# Patient Record
Sex: Female | Born: 1940 | Race: White | Hispanic: No | Marital: Married | Smoking: Never smoker
Health system: Southern US, Community
[De-identification: ages and names within clinical notes are randomized; demographics above are authoritative.]

## PROBLEM LIST (undated history)

## (undated) DIAGNOSIS — F0390 Unspecified dementia without behavioral disturbance: Secondary | ICD-10-CM

## (undated) DIAGNOSIS — E785 Hyperlipidemia, unspecified: Secondary | ICD-10-CM

## (undated) HISTORY — DX: Hyperlipidemia, unspecified: E78.5

## (undated) HISTORY — DX: Unspecified dementia, unspecified severity, without behavioral disturbance, psychotic disturbance, mood disturbance, and anxiety: F03.90

---

## 1998-06-06 ENCOUNTER — Ambulatory Visit (HOSPITAL_COMMUNITY): Admission: RE | Admit: 1998-06-06 | Discharge: 1998-06-06 | Payer: Self-pay | Admitting: *Deleted

## 1998-06-07 ENCOUNTER — Emergency Department (HOSPITAL_COMMUNITY): Admission: EM | Admit: 1998-06-07 | Discharge: 1998-06-07 | Payer: Self-pay | Admitting: Emergency Medicine

## 1998-06-11 ENCOUNTER — Encounter: Payer: Self-pay | Admitting: *Deleted

## 1998-06-11 ENCOUNTER — Inpatient Hospital Stay (HOSPITAL_COMMUNITY): Admission: EM | Admit: 1998-06-11 | Discharge: 1998-06-15 | Payer: Self-pay | Admitting: *Deleted

## 2002-07-03 ENCOUNTER — Encounter: Admission: RE | Admit: 2002-07-03 | Discharge: 2002-09-05 | Payer: Self-pay | Admitting: Specialist

## 2003-12-03 ENCOUNTER — Encounter: Admission: RE | Admit: 2003-12-03 | Discharge: 2004-02-26 | Payer: Self-pay | Admitting: *Deleted

## 2005-01-21 ENCOUNTER — Emergency Department (HOSPITAL_COMMUNITY): Admission: EM | Admit: 2005-01-21 | Discharge: 2005-01-21 | Payer: Self-pay | Admitting: Emergency Medicine

## 2005-02-04 ENCOUNTER — Ambulatory Visit (HOSPITAL_COMMUNITY): Admission: RE | Admit: 2005-02-04 | Discharge: 2005-02-04 | Payer: Self-pay | Admitting: Orthopedic Surgery

## 2009-05-12 ENCOUNTER — Encounter: Admission: RE | Admit: 2009-05-12 | Discharge: 2009-05-12 | Payer: Self-pay | Admitting: Orthopedic Surgery

## 2009-07-03 ENCOUNTER — Ambulatory Visit (HOSPITAL_COMMUNITY): Admission: RE | Admit: 2009-07-03 | Discharge: 2009-07-03 | Payer: Self-pay | Admitting: Gastroenterology

## 2009-07-24 ENCOUNTER — Encounter: Admission: RE | Admit: 2009-07-24 | Discharge: 2009-07-24 | Payer: Self-pay | Admitting: Neurosurgery

## 2010-07-14 DIAGNOSIS — L219 Seborrheic dermatitis, unspecified: Secondary | ICD-10-CM | POA: Insufficient documentation

## 2010-09-04 NOTE — Op Note (Signed)
[name], Olsen            ACCOUNT NO.:  [id number]   MEDICAL RECORD NO.:  [id number]          PATIENT TYPE:  AMB   LOCATION:  SDS                          FACILITY:  [facility]   PHYSICIAN:  Vania Rea. Supple, M.D.  DATE OF BIRTH:  1940/05/28   DATE OF PROCEDURE:  02/04/2005  DATE OF DISCHARGE:  02/04/2005                                 OPERATIVE REPORT   PREOPERATIVE DIAGNOSIS:  Displaced distal radius fracture.   POSTOPERATIVE DIAGNOSIS:  Displaced distal radius fracture.   OPERATION PERFORMED:  1.  Open reduction and internal fixation of displaced left distal radius      fracture and the use of,  2.  AlloMatrix bone grafting of capsular defect.   SURGEON:  Vania Rea. Supple, M.D.   Threasa HeadsFrench Ana A. Shuford, P. A.-C.   ANESTHESIA:  Axillary block.   TOURNIQUET TIME:  The tourniquet time was approximately one hour.   ESTIMATED BLOOD LOSS:  The estimated blood loss was minimal.   DRAINS:  None.   HISTORY:  Sue Olsen is a 70 year old female who two weeks ago on her  outstretched left upper extremity and sustained a displaced left radius  fracture.  I performed a closed reduction in the emergency room and follow  up x-rays in the office show initially excellent alignment.  However, at her  repeat follow up in the office yesterday; radiographs at that times showed  that there had been loss of reduction with apex volar angulation and loss of  radial length.  Due to the degree of displacement she is brought to the  operating room at this time for planned ORIF of the left wrist with bone  grafting.   Preoperatively I counseled Sue Olsen on treatment options as well as  risks versus benefits thereafter that are possible such as surgical  complications, bleeding, infection, neurovascular injury, persistent pain,  malunion, nonunion, loss of sensation, and/or possible need for additional  surgery.  The patient states she understands and accepts, and agrees to the  planned procedure.   DESCRIPTION OF OPERATION:  After undergoing routine preop evaluation the  patient received prophylactic antibiotics and had an axillary block  established in the holding area by the anesthesia department. The patient  was brought to the operating room and was placed supine on the operating  table where a tourniquet was applied to the left upper arm. The left upper  extremity was then sterilely prepped and draped in a standard fashion.  The  arm was exsanguinated with the tourniquet inflated with 250 mmHg.   A longitudinal incision was made over the distal 6 cm at the FCR  beginning  at the wrist flexion crease distally and then extending proximally. Skin  flaps were elevated and bipolar electrocautery was used for hemostasis.  The  FCR sheath was then divided longitudinally and the FCR was retracted  ulnarward and the radial arteries were retracted radialward, and dissection  carried down through the base of the FCR tendon sheath with the pronator  quadratus reflected from radial to ulnar with  subperiosteal dissection.  This allowed exposure of the volar aspect  of the distal portion of the  radius including the fracture site.  The fracture site was exposed,  mobilized, cleaned and reduction maneuver was performed.  A standard left  DVR plate was transfixed across the volar aspect of the radius, and using  fluoroscopic imaging proper positioning was established and a single  proximal fixation screw was applied.   We confirmed good alignment of the fracture site and good positioning of the  plate, and then sequentially applied the distal locking screws into the  distal metaphyseal segment.  Care was taken to make sure proper positioning  of the screws  was confirmed.  We then applied the two final cortical screws  to the shaft of the radius.  I should mention that we made a longitudinal  incision dorsally at the level of Lister's tubercle and dissected down  between  the fourth and fifth compartments, and entered the fracture site  dorsally.  We used an elevator through the fracture site to help gain proper  reduction of the distal radial segment.   Once all hardware had been placed and applied to our satisfaction final  radiographs were obtained confirming good alignment and good position of the  hardware.  We then mixed the AlloMatrix and applied AlloMatrix through the  bone window dorsally and packed the fracture site and metaphyseal defect  until it was completely filled.   Final x-rays were obtained.  The tourniquet was let down.  Hemostasis was  obtained.  The wound was then closed with interrupted 2-0 Vicryl for the  subcu layer and intracuticular Monocryl for the skin followed by Steri-  strips.  Half percent Marcaine plain was instilled along the skin edges of  the incision.  Brisk capillary refill was noted in the hand, we did have  palpable radial pulse prior to closure.  A soft dressing was then applied.   The patient was then transferred to the recovery room in stable condition.      Vania Rea. Supple, M.D.  Electronically Signed     KMS/MEDQ  D:  02/04/2005  T:  02/05/2005  Job:  [id number]

## 2010-09-24 ENCOUNTER — Other Ambulatory Visit: Payer: Self-pay | Admitting: Neurosurgery

## 2010-09-24 DIAGNOSIS — M5126 Other intervertebral disc displacement, lumbar region: Secondary | ICD-10-CM

## 2010-10-01 ENCOUNTER — Ambulatory Visit
Admission: RE | Admit: 2010-10-01 | Discharge: 2010-10-01 | Disposition: A | Discharge status: Home / Self Care | Payer: Medicare Other | Source: Ambulatory Visit | Attending: Neurosurgery | Admitting: Neurosurgery

## 2010-10-01 DIAGNOSIS — M5126 Other intervertebral disc displacement, lumbar region: Secondary | ICD-10-CM

## 2011-08-04 DIAGNOSIS — J309 Allergic rhinitis, unspecified: Secondary | ICD-10-CM | POA: Insufficient documentation

## 2012-06-26 ENCOUNTER — Other Ambulatory Visit: Payer: Self-pay | Admitting: Otolaryngology

## 2012-06-26 DIAGNOSIS — J309 Allergic rhinitis, unspecified: Secondary | ICD-10-CM

## 2012-06-28 ENCOUNTER — Ambulatory Visit
Admission: RE | Admit: 2012-06-28 | Discharge: 2012-06-28 | Disposition: A | Discharge status: Home / Self Care | Payer: Medicare Other | Source: Ambulatory Visit | Attending: Otolaryngology | Admitting: Otolaryngology

## 2012-06-28 DIAGNOSIS — J309 Allergic rhinitis, unspecified: Secondary | ICD-10-CM

## 2012-07-19 ENCOUNTER — Other Ambulatory Visit: Payer: Self-pay | Admitting: Otolaryngology

## 2012-07-19 ENCOUNTER — Ambulatory Visit
Admission: RE | Admit: 2012-07-19 | Discharge: 2012-07-19 | Disposition: A | Discharge status: Home / Self Care | Payer: Medicare Other | Source: Ambulatory Visit | Attending: Otolaryngology | Admitting: Otolaryngology

## 2012-07-19 DIAGNOSIS — M542 Cervicalgia: Secondary | ICD-10-CM

## 2012-07-19 DIAGNOSIS — H9209 Otalgia, unspecified ear: Secondary | ICD-10-CM

## 2012-12-19 DIAGNOSIS — R030 Elevated blood-pressure reading, without diagnosis of hypertension: Secondary | ICD-10-CM | POA: Insufficient documentation

## 2012-12-21 DIAGNOSIS — R319 Hematuria, unspecified: Secondary | ICD-10-CM | POA: Insufficient documentation

## 2012-12-22 DIAGNOSIS — E785 Hyperlipidemia, unspecified: Secondary | ICD-10-CM | POA: Insufficient documentation

## 2012-12-22 DIAGNOSIS — E559 Vitamin D deficiency, unspecified: Secondary | ICD-10-CM | POA: Insufficient documentation

## 2016-03-17 ENCOUNTER — Ambulatory Visit (INDEPENDENT_AMBULATORY_CARE_PROVIDER_SITE_OTHER): Payer: Medicare Other | Admitting: Physician Assistant

## 2016-03-17 ENCOUNTER — Ambulatory Visit (INDEPENDENT_AMBULATORY_CARE_PROVIDER_SITE_OTHER): Payer: Medicare Other

## 2016-03-17 VITALS — BP 122/72 | HR 65 | Temp 98.1°F | Resp 17 | Ht 60.0 in | Wt 118.0 lb

## 2016-03-17 DIAGNOSIS — R05 Cough: Secondary | ICD-10-CM

## 2016-03-17 DIAGNOSIS — R059 Cough, unspecified: Secondary | ICD-10-CM

## 2016-03-17 LAB — POCT CBC
Granulocyte percent: 67.9 %G (ref 37–80)
HCT, POC: 39.4 % (ref 37.7–47.9)
Hemoglobin: 13.6 g/dL (ref 12.2–16.2)
Lymph, poc: 2.8 (ref 0.6–3.4)
MCH, POC: 29.7 pg (ref 27–31.2)
MCHC: 34.5 g/dL (ref 31.8–35.4)
MCV: 86.1 fL (ref 80–97)
MID (cbc): 0.7 (ref 0–0.9)
MPV: 7 fL (ref 0–99.8)
POC Granulocyte: 7.3 — AB (ref 2–6.9)
POC LYMPH PERCENT: 25.9 %L (ref 10–50)
POC MID %: 6.2 %M (ref 0–12)
Platelet Count, POC: 271 10*3/uL (ref 142–424)
RBC: 4.58 M/uL (ref 4.04–5.48)
RDW, POC: 13.9 %
WBC: 10.7 10*3/uL — AB (ref 4.6–10.2)

## 2016-03-17 MED ORDER — DOXYCYCLINE HYCLATE 100 MG PO CAPS: 100.0000 mg | ORAL_CAPSULE | Freq: Two times a day (BID) | ORAL | 0 refills | Status: AC

## 2016-03-17 MED ORDER — RANITIDINE HCL 150 MG PO TABS: 150.0000 mg | ORAL_TABLET | Freq: Two times a day (BID) | ORAL | 0 refills | Status: DC

## 2016-03-17 NOTE — Patient Instructions (Addendum)
There is no evidence of infection on the chest x-ray. There is a very mild increase in the white blood cells, which may represent an infection, so I'm prescribing another antibiotic. Please TAKE IT WITH FOOD, as it may cause nausea.  The other medication is to help with the nausea and fullness in the throat and chest that may be caused by increased acid. If your symptoms worsen or persist, please follow up with Dr. Everlene OtherBouska or return here for additional evaluation.    IF you received an x-ray today, you will receive an invoice from [facility]. Please contact [facility] at [phone] with questions or concerns regarding your invoice.   IF you received labwork today, you will receive an invoice from [facility]. Please contact [facility] at [phone] with questions or concerns regarding your invoice.   Our billing staff will not be able to assist you with questions regarding bills from these companies.  You will be contacted with the lab results as soon as they are available. The fastest way to get your results is to activate your My Chart account. Instructions are located on the last page of this paperwork. If you have not heard from us regarding the results in 2 weeks, please contact this office.

## 2016-03-17 NOTE — Progress Notes (Signed)
Patient ID: Sue Olsen, female     DOB: 1940/08/02, 75 y.o.    MRN: [id number]  PCP: Aura Dials, MD  Chief Complaint  Patient presents with  . URI  . Sore Throat    throat pain     Subjective:   This patient is new to this practice and presents for evaluation of sore throat. She is accompanied by her husband.  Began with a sore throat. She saw her PCP, who prescribed azithromycin.  His note is reviewed in Care Everywhere. She reported 2 weeks of worsening sinus pressure and sore throat with 5 days of white blisters in the throat, a mild cough. The azithromycin had actually been called in 10 days previously, but she hadn't picked it up, and so was encouraged to do so, and to return if not better in 10 days.  After two doses, she felt "really bad" and her pharmacist told her she was allergic, but should still take the third dose of the medication. She was advised to contact her physician.  When she called her PCP on 11/22, he called in medication for nausea (ondansetron).  Now she feels burning in her chest.  Throat and esophagus are burning. They feel "full." Denies any persistent nasal/sinus symptoms. No fever/chills. Nausea is intermittent, and she relates that it was worse when she took the ondansetron, so she stopped it. No vomiting. Has had some diarrhea, while taking the azithromycin, and a couple of times since then including yesterday. No blood or mucous in the stool. Decreased appetite.  She presents here today because she wanted a place that could see her and perform the xray she believes she needs.   Review of Systems  Constitutional: Positive for appetite change and fatigue. Negative for chills and fever.  HENT: Positive for postnasal drip. Negative for congestion, rhinorrhea, sinus pain, sinus pressure, sneezing, sore throat (feels full), trouble swallowing and voice change.   Eyes: Negative for pain, discharge, redness and itching.  Respiratory:  Negative for cough, choking, chest tightness ("fullness"), shortness of breath, wheezing and stridor.   Cardiovascular: Negative for chest pain, palpitations and leg swelling.  Gastrointestinal: Positive for diarrhea and nausea. Negative for abdominal distention, abdominal pain, anal bleeding, blood in stool, constipation, rectal pain and vomiting.  Endocrine: Negative.   Genitourinary: Negative for flank pain, frequency, hematuria and urgency.  Musculoskeletal: Negative for myalgias.  Neurological: Negative for dizziness, facial asymmetry, weakness, light-headedness and headaches.  Hematological: Negative for adenopathy. Does not bruise/bleed easily.     Prior to Admission medications   Medication Sig Start Date End Date Taking? Authorizing Provider  Calcium Carbonate-Vitamin D (CALCIUM-VITAMIN D) 500-200 MG-UNIT tablet Take by mouth.    Historical Provider, MD  Cholecalciferol (VITAMIN D3) 400 units CHEW Chew by mouth.    Historical Provider, MD     Not on File   Patient Active Problem List   Diagnosis Date Noted  . Hyperlipidemia 12/22/2012  . Vitamin D deficiency 12/22/2012  . Hematuria 12/21/2012  . White coat syndrome without diagnosis of hypertension 12/19/2012  . Allergic rhinitis 08/04/2011  . Seborrheic dermatitis 07/14/2010     No family history on file.   Social History   Social History  . Marital status: Married    Spouse name: N/A  . Number of children: N/A  . Years of education: N/A   Occupational History  . Not on file.   Social History Main Topics  . Smoking status: Never Smoker  . Smokeless tobacco: Never Used  .  Alcohol use No  . Drug use: No  . Sexual activity: No   Other Topics Concern  . Not on file   Social History Narrative  . No narrative on file         Objective:  Physical Exam  Constitutional: She is oriented to person, place, and time. She appears well-developed and well-nourished. She is active and cooperative. No  distress.  BP 122/72 (BP Location: Right Arm, Patient Position: Sitting, Cuff Size: Normal)   Pulse 65   Temp 98.1 F (36.7 C) (Oral)   Resp 17   Ht 5' (1.524 m)   Wt 118 lb (53.5 kg)   SpO2 97%   BMI 23.05 kg/m   HENT:  Head: Normocephalic and atraumatic.  Right Ear: Hearing normal.  Left Ear: Hearing normal.  Eyes: Conjunctivae are normal. No scleral icterus.  Neck: Normal range of motion. Neck supple. No thyromegaly present.  Cardiovascular: Normal rate, regular rhythm and normal heart sounds.   Pulses:      Radial pulses are 2+ on the right side, and 2+ on the left side.  Pulmonary/Chest: Effort normal and breath sounds normal.  Abdominal: Normal appearance and bowel sounds are normal. She exhibits no distension and no mass. There is no hepatosplenomegaly. There is no tenderness.  Lymphadenopathy:       Head (right side): No tonsillar, no preauricular, no posterior auricular and no occipital adenopathy present.       Head (left side): No tonsillar, no preauricular, no posterior auricular and no occipital adenopathy present.    She has no cervical adenopathy.       Right: No supraclavicular adenopathy present.       Left: No supraclavicular adenopathy present.  Neurological: She is alert and oriented to person, place, and time. No sensory deficit.  Skin: Skin is warm, dry and intact. No rash noted. No cyanosis or erythema. Nails show no clubbing.  Psychiatric: She has a normal mood and affect. Her speech is normal and behavior is normal.    Dg Chest 2 View  Result Date: 03/17/2016 CLINICAL DATA:  Cough, burning and fullness in chest, sore throat and congestion for 2 weeks EXAM: CHEST  2 VIEW COMPARISON:  None FINDINGS: Normal heart size, mediastinal contours, and pulmonary vascularity. Peribronchial thickening with minimal hyperinflation. No acute infiltrate, pleural effusion or pneumothorax. Bones demineralized with scattered endplate spur formation thoracic spine. IMPRESSION:  Bronchitic changes and slight hyperinflation without acute infiltrate. Electronically Signed   By: Ulyses SouthwardMark  Boles M.D.   On: 03/17/2016 11:28    Results for orders placed or performed in visit on 03/17/16  POCT CBC  Result Value Ref Range   WBC 10.7 (A) 4.6 - 10.2 K/uL   Lymph, poc 2.8 0.6 - 3.4   POC LYMPH PERCENT 25.9 10 - 50 %L   MID (cbc) 0.7 0 - 0.9   POC MID % 6.2 0 - 12 %M   POC Granulocyte 7.3 (A) 2 - 6.9   Granulocyte percent 67.9 37 - 80 %G   RBC 4.58 4.04 - 5.48 M/uL   Hemoglobin 13.6 12.2 - 16.2 g/dL   HCT, POC 40.939.4 81.137.7 - 47.9 %   MCV 86.1 80 - 97 fL   MCH, POC 29.7 27 - 31.2 pg   MCHC 34.5 31.8 - 35.4 g/dL   RDW, POC 91.413.9 %   Platelet Count, POC 271 142 - 424 K/uL   MPV 7.0 0 - 99.8 fL  Assessment & Plan:  1. Cough Mild elevation of WBC with mild left shift. Elect to cover for bacterial pulmonary process with doxycycline, despite negative CXR. This may represent LPR, so ranitidine recommended BID. Follow-up with PCP, or here, if symptoms worsen/persist.  - POCT CBC - DG Chest 2 View; Future - ranitidine (ZANTAC) 150 MG tablet; Take 1 tablet (150 mg total) by mouth 2 (two) times daily.  Dispense: 60 tablet; Refill: 0 - doxycycline (VIBRAMYCIN) 100 MG capsule; Take 1 capsule (100 mg total) by mouth 2 (two) times daily.  Dispense: 20 capsule; Refill: 0  Georga Stys S. Vir Whetstine, PA-C Physician Assistant-[facility]

## 2016-05-28 ENCOUNTER — Other Ambulatory Visit: Payer: Self-pay | Admitting: Internal Medicine

## 2016-05-28 DIAGNOSIS — R10827 Generalized rebound abdominal tenderness: Secondary | ICD-10-CM

## 2016-05-28 DIAGNOSIS — R110 Nausea: Secondary | ICD-10-CM

## 2016-05-31 ENCOUNTER — Ambulatory Visit
Admission: RE | Admit: 2016-05-31 | Discharge: 2016-05-31 | Disposition: A | Discharge status: Home / Self Care | Payer: Medicare Other | Source: Ambulatory Visit | Attending: Internal Medicine | Admitting: Internal Medicine

## 2016-05-31 DIAGNOSIS — R10827 Generalized rebound abdominal tenderness: Secondary | ICD-10-CM

## 2016-05-31 DIAGNOSIS — R110 Nausea: Secondary | ICD-10-CM

## 2016-05-31 MED ORDER — IOPAMIDOL (ISOVUE-300) INJECTION 61%
100.0000 mL | Freq: Once | INTRAVENOUS | Status: AC | PRN
  Administered 2016-05-31: 100 mL via INTRAVENOUS

## 2016-10-15 ENCOUNTER — Other Ambulatory Visit: Payer: Self-pay | Admitting: Otolaryngology

## 2016-10-15 DIAGNOSIS — R1313 Dysphagia, pharyngeal phase: Secondary | ICD-10-CM

## 2016-10-15 DIAGNOSIS — R0989 Other specified symptoms and signs involving the circulatory and respiratory systems: Secondary | ICD-10-CM

## 2016-10-21 ENCOUNTER — Ambulatory Visit
Admission: RE | Admit: 2016-10-21 | Discharge: 2016-10-21 | Disposition: A | Discharge status: Home / Self Care | Payer: Medicare Other | Source: Ambulatory Visit | Attending: Otolaryngology | Admitting: Otolaryngology

## 2016-10-21 DIAGNOSIS — R0989 Other specified symptoms and signs involving the circulatory and respiratory systems: Secondary | ICD-10-CM

## 2016-10-21 DIAGNOSIS — R1313 Dysphagia, pharyngeal phase: Secondary | ICD-10-CM

## 2016-11-05 ENCOUNTER — Other Ambulatory Visit (HOSPITAL_COMMUNITY): Payer: Self-pay | Admitting: Family Medicine

## 2016-11-05 DIAGNOSIS — R1319 Other dysphagia: Secondary | ICD-10-CM

## 2016-11-10 ENCOUNTER — Ambulatory Visit (HOSPITAL_COMMUNITY)
Admission: RE | Admit: 2016-11-10 | Discharge: 2016-11-10 | Disposition: A | Discharge status: Home / Self Care | Payer: Medicare Other | Source: Ambulatory Visit | Attending: Family Medicine | Admitting: Family Medicine

## 2016-11-10 DIAGNOSIS — R1319 Other dysphagia: Secondary | ICD-10-CM

## 2016-11-10 DIAGNOSIS — R1310 Dysphagia, unspecified: Secondary | ICD-10-CM | POA: Diagnosis not present

## 2016-11-10 DIAGNOSIS — R1313 Dysphagia, pharyngeal phase: Secondary | ICD-10-CM | POA: Insufficient documentation

## 2018-01-17 ENCOUNTER — Other Ambulatory Visit: Payer: Self-pay | Admitting: Internal Medicine

## 2018-01-17 DIAGNOSIS — Z1239 Encounter for other screening for malignant neoplasm of breast: Secondary | ICD-10-CM

## 2018-03-01 ENCOUNTER — Other Ambulatory Visit (HOSPITAL_COMMUNITY): Payer: Self-pay | Admitting: *Deleted

## 2018-03-02 ENCOUNTER — Ambulatory Visit (HOSPITAL_COMMUNITY)
Admission: RE | Admit: 2018-03-02 | Discharge: 2018-03-02 | Disposition: A | Discharge status: Home / Self Care | Payer: Medicare Other | Source: Ambulatory Visit | Attending: Internal Medicine | Admitting: Internal Medicine

## 2018-03-02 DIAGNOSIS — M810 Age-related osteoporosis without current pathological fracture: Secondary | ICD-10-CM | POA: Diagnosis not present

## 2018-03-02 MED ORDER — ZOLEDRONIC ACID 5 MG/100ML IV SOLN
5.0000 mg | Freq: Once | INTRAVENOUS | Status: AC
  Administered 2018-03-02: 5 mg via INTRAVENOUS

## 2018-03-02 MED ORDER — ZOLEDRONIC ACID 5 MG/100ML IV SOLN
INTRAVENOUS | Status: AC
  Administered 2018-03-02: 5 mg via INTRAVENOUS
  Filled 2018-03-02: qty 100

## 2018-03-02 NOTE — Discharge Instructions (Signed)

## 2019-01-16 ENCOUNTER — Other Ambulatory Visit: Payer: Self-pay | Admitting: Internal Medicine

## 2019-01-16 DIAGNOSIS — F0280 Dementia in other diseases classified elsewhere without behavioral disturbance: Secondary | ICD-10-CM

## 2019-01-16 DIAGNOSIS — I6522 Occlusion and stenosis of left carotid artery: Secondary | ICD-10-CM

## 2019-01-31 ENCOUNTER — Other Ambulatory Visit: Payer: Self-pay | Admitting: Internal Medicine

## 2019-01-31 DIAGNOSIS — I6522 Occlusion and stenosis of left carotid artery: Secondary | ICD-10-CM

## 2019-02-03 ENCOUNTER — Other Ambulatory Visit: Payer: Medicare Other

## 2019-02-16 ENCOUNTER — Ambulatory Visit
Admission: RE | Admit: 2019-02-16 | Discharge: 2019-02-16 | Disposition: A | Discharge status: Home / Self Care | Payer: Medicare Other | Source: Ambulatory Visit | Attending: Internal Medicine | Admitting: Internal Medicine

## 2019-02-16 ENCOUNTER — Other Ambulatory Visit: Payer: Self-pay

## 2019-02-16 DIAGNOSIS — G300 Alzheimer's disease with early onset: Secondary | ICD-10-CM

## 2019-02-16 DIAGNOSIS — F0280 Dementia in other diseases classified elsewhere without behavioral disturbance: Secondary | ICD-10-CM

## 2019-02-16 DIAGNOSIS — I6522 Occlusion and stenosis of left carotid artery: Secondary | ICD-10-CM

## 2019-02-16 MED ORDER — GADOBENATE DIMEGLUMINE 529 MG/ML IV SOLN
10.0000 mL | Freq: Once | INTRAVENOUS | Status: AC | PRN
  Administered 2019-02-16: 10 mL via INTRAVENOUS

## 2019-05-15 ENCOUNTER — Other Ambulatory Visit: Payer: Self-pay

## 2019-05-15 ENCOUNTER — Ambulatory Visit: Payer: Medicare Other | Admitting: Adult Health Nurse Practitioner

## 2019-05-15 VITALS — BP 160/60 | HR 61 | Temp 98.0°F | Ht 62.0 in | Wt 112.2 lb

## 2019-05-15 DIAGNOSIS — S81811A Laceration without foreign body, right lower leg, initial encounter: Secondary | ICD-10-CM | POA: Diagnosis not present

## 2019-05-15 NOTE — Patient Instructions (Signed)
° ° ° °  If you have lab work done today you will be contacted with your lab results within the next 2 weeks.  If you have not heard from us then please contact us. The fastest way to get your results is to register for My Chart. ° ° °IF you received an x-ray today, you will receive an invoice from [facility]. Please contact  Radiology at [phone] with questions or concerns regarding your invoice.  ° °IF you received labwork today, you will receive an invoice from [facility]. Please contact [facility] at [phone] with questions or concerns regarding your invoice.  ° °Our billing staff will not be able to assist you with questions regarding bills from these companies. ° °You will be contacted with the lab results as soon as they are available. The fastest way to get your results is to activate your My Chart account. Instructions are located on the last page of this paperwork. If you have not heard from us regarding the results in 2 weeks, please contact this office. °  ° ° ° °

## 2019-05-19 ENCOUNTER — Ambulatory Visit: Payer: Medicare Other

## 2019-05-22 ENCOUNTER — Encounter: Payer: Self-pay | Admitting: Adult Health Nurse Practitioner

## 2019-05-22 DIAGNOSIS — S81811A Laceration without foreign body, right lower leg, initial encounter: Secondary | ICD-10-CM | POA: Insufficient documentation

## 2019-05-22 NOTE — Progress Notes (Signed)
  Chief Complaint  Patient presents with  . scrapped knee    pt stated that she was getting something out her closet and slide and hit her lower leg.    HPI   Patient is a pleasant 79 year old female who was getting something out of her closet and the door hit her lower extremity on the right.  She has an open laceration which is bruised and bleeding.   Problem List    Problem List: 2014-09: Hyperlipidemia 2014-09: Vitamin D deficiency 2014-09: Hematuria 2014-09: White coat syndrome without diagnosis of hypertension 2013-04: Allergic rhinitis 2012-03: Seborrheic dermatitis   Allergies   has No Known Allergies.  Medications    Current Outpatient Medications:  .  aspirin 81 MG chewable tablet, Chew 81 mg by mouth daily., Disp: , Rfl:  .  Calcium Carbonate-Vitamin D (CALCIUM-VITAMIN D) 500-200 MG-UNIT tablet, Take by mouth., Disp: , Rfl:  .  Cholecalciferol (VITAMIN D3) 400 units CHEW, Chew by mouth., Disp: , Rfl:  .  fluticasone (FLONASE) 50 MCG/ACT nasal spray, Place into both nostrils daily., Disp: , Rfl:  .  ranitidine (ZANTAC) 150 MG tablet, Take 1 tablet (150 mg total) by mouth 2 (two) times daily. (Patient not taking: Reported on 05/15/2019), Disp: 60 tablet, Rfl: 0   Review of Systems    Constitutional: Negative for activity change, appetite change, chills and fever.  HENT: Negative for congestion, nosebleeds, trouble swallowing and voice change.   Respiratory: Negative for cough, shortness of breath and wheezing.   Cardiac:  Negative for chest pain, pressure, syncope  Gastrointestinal: Negative for diarrhea, nausea and vomiting.  Genitourinary: Negative for difficulty urinating, dysuria, flank pain and hematuria.  Musculoskeletal: Negative for back pain, joint swelling and neck pain.  Neurological: Negative for dizziness, speech difficulty, light-headedness and numbness.  Skin: Dermatological ROS: positive for laceration negative for eczema and skin lesion  changes  See HPI. All other review of systems negative.     Physical Exam:    height is 5\' 2"  (1.575 m) and weight is 112 lb 3.2 oz (50.9 kg). Her temporal temperature is 98 F (36.7 C). Her blood pressure is 160/60 (abnormal) and her pulse is 61. Her oxygen saturation is 97%.   Physical Examination: General appearance - alert, well appearing, and in no distress and oriented to person, place, and time Mental status - normal mood, behavior, speech, dress, motor activity, and thought processes Eyes - PERRL. Extraocular movements intact.  No nystagmus.  Neck - supple, no significant adenopathy, carotids upstroke normal bilaterally, no bruits, thyroid exam: thyroid is normal in size without nodules or tenderness Chest - clear to auscultation, no wheezes, rales or rhonchi, symmetric air entry  Heart - normal rate, regular rhythm, normal S1, S2, no murmurs, rubs, clicks or gallops Extremities - dependent LE edema without clubbing or cyanosis Skin - approximately 3 x 3 cm area of open laceration with epidermis open to air.  See wound care note.  No hyperpigmentation of skin.  No current hematomas noted   Wound Care    RLE with laceration.  Cleaned with betadine.  Applied [employer] iodiform dressing with dry gauze, and Coflex.  Patient tolerated wound care and was given instructions on how to care for wound.   Assessment & Plan:  Sue Olsen is a 79 y.o. female   1. Laceration of skin of right lower leg, initial encounter      76, NP

## 2019-05-24 ENCOUNTER — Ambulatory Visit: Payer: Medicare Other

## 2019-05-25 ENCOUNTER — Ambulatory Visit: Payer: Medicare Other | Attending: Internal Medicine

## 2019-05-25 DIAGNOSIS — Z23 Encounter for immunization: Secondary | ICD-10-CM | POA: Insufficient documentation

## 2019-05-25 NOTE — Progress Notes (Signed)
   Covid-19 Vaccination Clinic  Name:  [name]    MRN: [id number] DOB: 1941/01/24  05/25/2019  [name] was observed post Covid-19 immunization for 15 minutes without incidence. She was provided with Vaccine Information Sheet and instruction to access the V-Safe system.   Ms. Corona was instructed to call 911 with any severe reactions post vaccine: Marland Kitchen Difficulty breathing  . Swelling of your face and throat  . A fast heartbeat  . A bad rash all over your body  . Dizziness and weakness    Immunizations Administered    Name Date Dose VIS Date Route   Pfizer COVID-19 Vaccine 05/25/2019  1:45 PM 0.3 mL 03/30/2019 Intramuscular   Manufacturer: [employer], [employer]   Lot: [id number]   NDC: [address]-2

## 2019-06-19 ENCOUNTER — Ambulatory Visit: Payer: Medicare Other | Attending: Internal Medicine

## 2019-06-19 DIAGNOSIS — Z23 Encounter for immunization: Secondary | ICD-10-CM

## 2019-06-19 NOTE — Progress Notes (Signed)
   Covid-19 Vaccination Clinic  Name:  Sue Olsen    MRN: [id number] DOB: 09/06/1940  06/19/2019  [name] was observed post Covid-19 immunization for 15 minutes without incident. She was provided with Vaccine Information Sheet and instruction to access the V-Safe system.   [name] was instructed to call 911 with any severe reactions post vaccine: Marland Kitchen Difficulty breathing  . Swelling of face and throat  . A fast heartbeat  . A bad rash all over body  . Dizziness and weakness   Immunizations Administered    Name Date Dose VIS Date Route   Pfizer COVID-19 Vaccine 06/19/2019 12:59 PM 0.3 mL 03/30/2019 Intramuscular   Manufacturer: [employer], [employer]   Lot: [id number]   NDC: [address]-5

## 2020-02-06 ENCOUNTER — Other Ambulatory Visit (HOSPITAL_COMMUNITY): Payer: Self-pay | Admitting: *Deleted

## 2020-02-07 ENCOUNTER — Encounter (HOSPITAL_COMMUNITY)
Admission: RE | Admit: 2020-02-07 | Discharge: 2020-02-07 | Disposition: A | Discharge status: Home / Self Care | Payer: Medicare Other | Source: Ambulatory Visit | Attending: Internal Medicine | Admitting: Internal Medicine

## 2020-02-07 DIAGNOSIS — M810 Age-related osteoporosis without current pathological fracture: Secondary | ICD-10-CM | POA: Insufficient documentation

## 2020-02-07 MED ORDER — ZOLEDRONIC ACID 5 MG/100ML IV SOLN
INTRAVENOUS | Status: AC
  Filled 2020-02-07: qty 100

## 2020-02-07 MED ORDER — ZOLEDRONIC ACID 5 MG/100ML IV SOLN
5.0000 mg | Freq: Once | INTRAVENOUS | Status: AC
  Administered 2020-02-07: 5 mg via INTRAVENOUS

## 2021-01-22 IMAGING — MR MR HEAD WO/W CM
12 series · 48 of 48 positions shown · IV contrast (multihance)
Comparison: None.

CLINICAL DATA: Early-onset Alzheimer's disease.

EXAM:
MRI HEAD WITHOUT AND WITH CONTRAST
MRA HEAD WITHOUT CONTRAST
TECHNIQUE: Multiplanar, multiecho pulse sequences of the brain and surrounding
structures were obtained without and with intravenous contrast.
Angiographic images of the head were obtained using MRA technique
without contrast.
CONTRAST:  10mL MULTIHANCE GADOBENATE DIMEGLUMINE 529 MG/ML IV SOLN

[Series 2: T1 · sagittal · 5.0mm · 0.45mm/px · 1 of 21 slices shown]
[im 1/21]
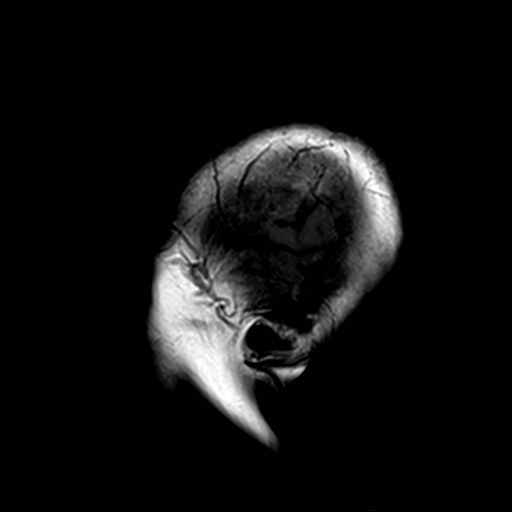

[Series 3: DWI · axial · 3.0mm · 1.80mm/px · z∈[-64,+83]mm · 7 of 96 slices shown (1 of 4)]
[im 1/96]
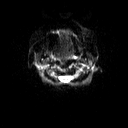
[im 16/96]
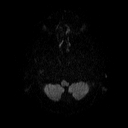
[im 32/96]
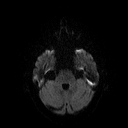
[im 48/96]
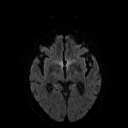
[im 64/96]
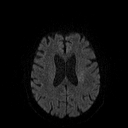
[im 80/96]
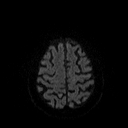
[im 96/96]
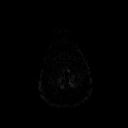

[Series 4: DWI · axial · 3.0mm · 1.80mm/px · z∈[-64,+83]mm · 3 of 50 slices shown (2 of 4)]
[im 1/50]
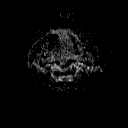
[im 25/50]
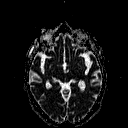
[im 50/50]
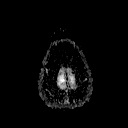

[Series 5: DWI · coronal · 5.0mm · 1.80mm/px · 5 of 68 slices shown (3 of 4)]
[im 1/68]
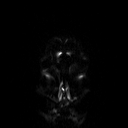
[im 17/68]
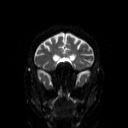
[im 34/68]
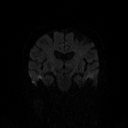
[im 51/68]
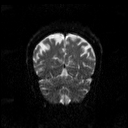
[im 68/68]
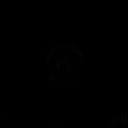

[Series 6: DWI · coronal · 5.0mm · 1.80mm/px · 2 of 34 slices shown (4 of 4)]
[im 1/34]
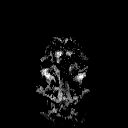
[im 34/34]
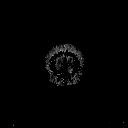

[Series 7: T2 · axial · 5.0mm · 0.51mm/px · z∈[-61,+80]mm · 2 of 22 slices shown (1 of 2)]
[im 1/22]
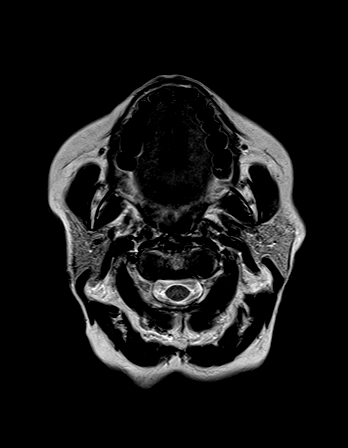
[im 22/22]
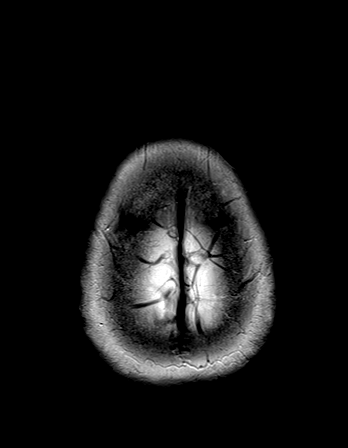

[Series 8: FLAIR · axial · 3.0mm · 0.45mm/px · z∈[-58,+77]mm · 2 of 30 slices shown]
[im 1/30]
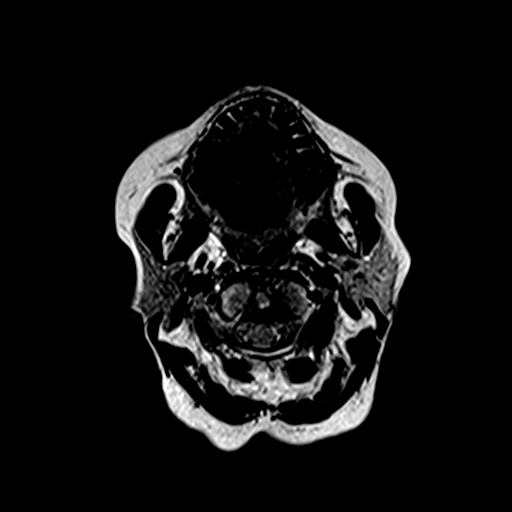
[im 30/30]
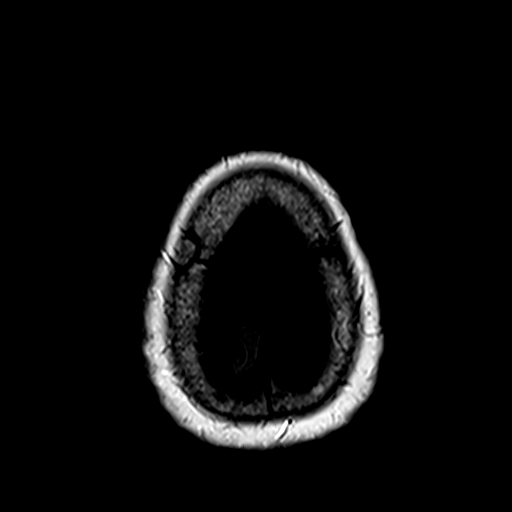

[Series 10: swi_images · axial · 4.0mm · 0.90mm/px · z∈[-61,+79]mm · 2 of 36 slices shown]
[im 1/36]
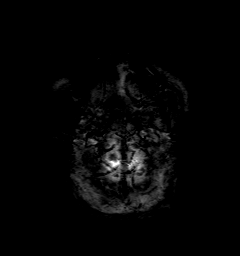
[im 36/36]
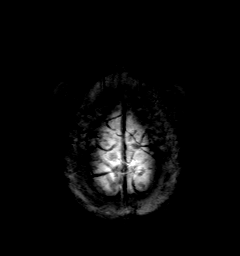

[Series 11: t1_mpr_tra · axial · 1.0mm · 0.75mm/px · z∈[-62,+81]mm · 10 of 144 slices shown (1 of 2)]
[im 1/144]
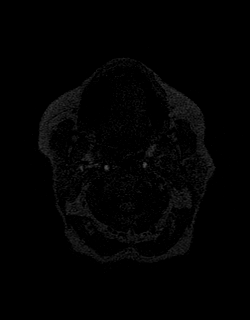
[im 16/144]
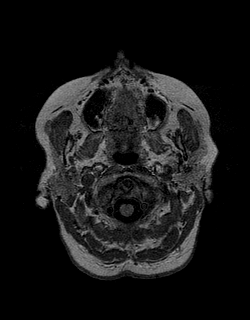
[im 32/144]
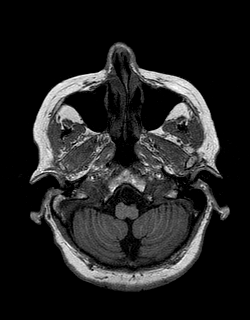
[im 48/144]
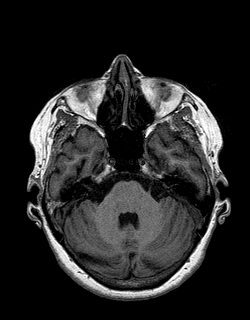
[im 64/144]
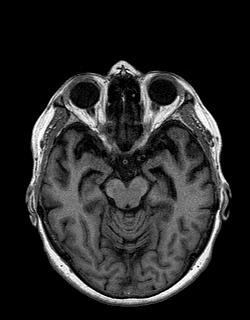
[im 80/144]
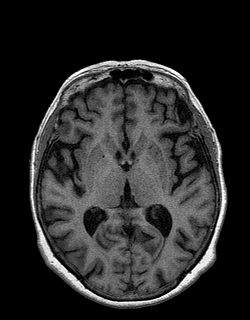
[im 96/144]
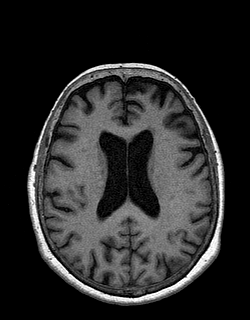
[im 112/144]
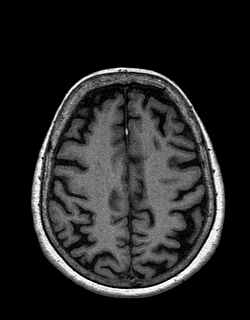
[im 128/144]
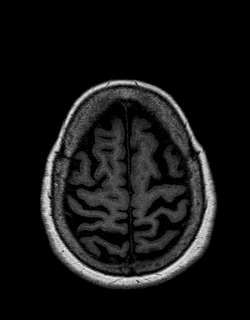
[im 144/144]
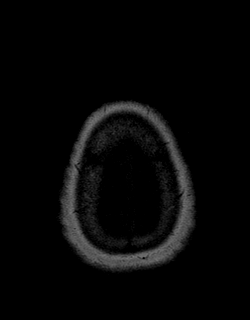

[Series 12: T2 · coronal · 5.0mm · 0.45mm/px · 2 of 25 slices shown (2 of 2)]
[im 1/25]
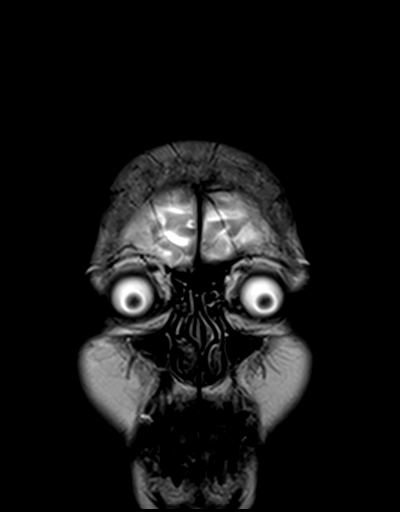
[im 25/25]
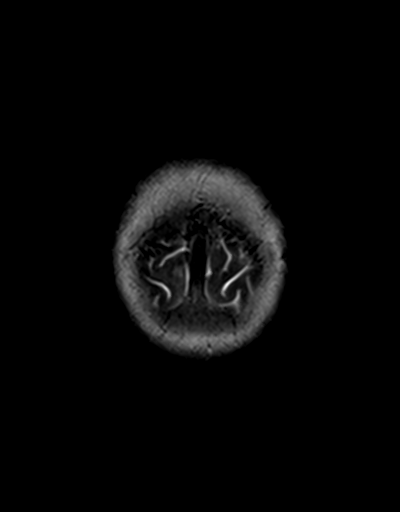

[Series 13: t1_mpr_tra · axial · 1.0mm · 0.75mm/px · z∈[-62,+81]mm · 10 of 144 slices shown (2 of 2)]
[im 1/144]
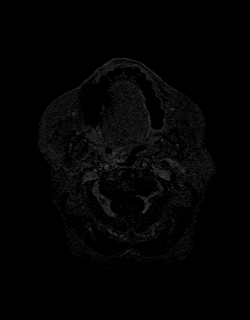
[im 16/144]
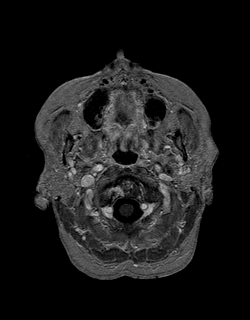
[im 32/144]
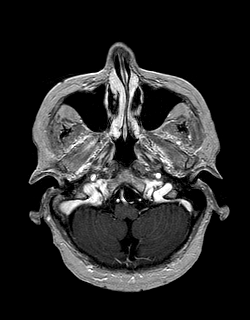
[im 48/144]
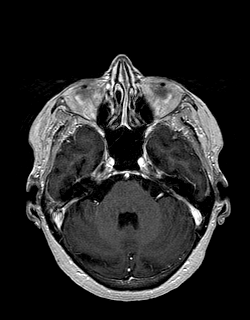
[im 64/144]
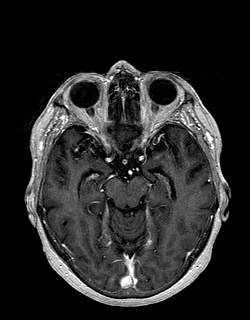
[im 80/144]
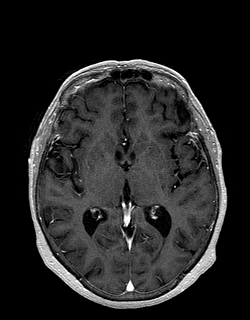
[im 96/144]
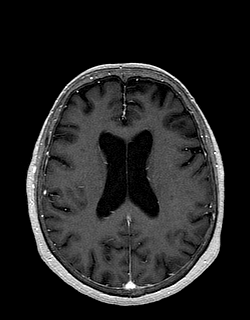
[im 112/144]
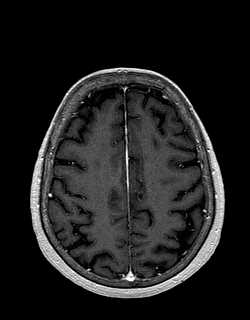
[im 128/144]
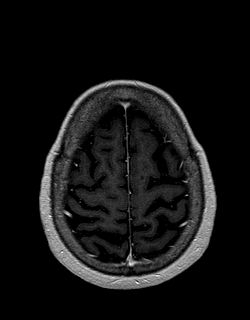
[im 144/144]
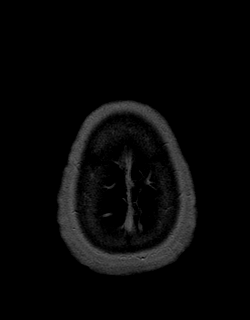

[Series 14: post cor · coronal · 5.0mm · 0.45mm/px · 2 of 25 slices shown]
[im 1/25]
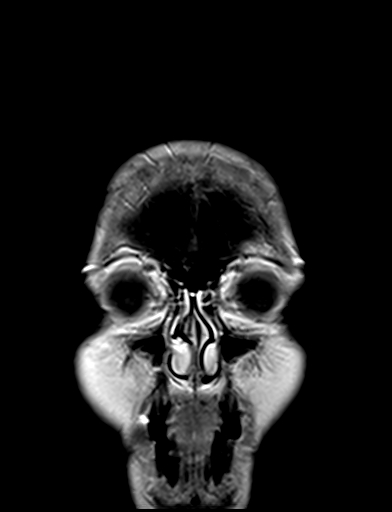
[im 25/25]
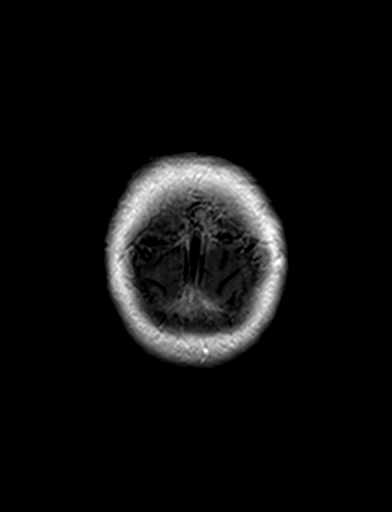

[48 of 48 positions shown; findings below may reference images not displayed]

FINDINGS: MRI HEAD FINDINGS

BRAIN: There is no acute infarct, acute hemorrhage or extra-axial
collection. Multifocal white matter hyperintensity, most commonly
due to chronic ischemic microangiopathy. There is an old left
frontal subcortical infarct. There is generalized atrophy without
lobar predilection. The midline structures are normal.

VASCULAR: The major intracranial arterial and venous sinus flow
voids are normal. Susceptibility-sensitive sequences show no chronic
microhemorrhage or superficial siderosis.

SKULL AND UPPER CERVICAL SPINE: Calvarial bone marrow signal is
normal. There is no skull base mass. The visualized upper cervical
spine and soft tissues are normal.

SINUSES/ORBITS: There are no fluid levels or advanced mucosal
thickening. The mastoid air cells and middle ear cavities are free
of fluid. The orbits are normal.

MRA HEAD FINDINGS

POSTERIOR CIRCULATION:

--Vertebral arteries: Normal V4 segments.

--Posterior inferior cerebellar arteries (PICA): Patent origins from
the vertebral arteries.

--Anterior inferior cerebellar arteries (AICA): Patent origins from
the basilar artery.

--Basilar artery: Normal.

--Superior cerebellar arteries: Normal.

--Posterior cerebral arteries: Normal. Both originate from the
basilar artery. Posterior communicating arteries (p-comm) are
diminutive or absent.

ANTERIOR CIRCULATION:

--Intracranial internal carotid arteries: Normal.

--Anterior cerebral arteries (ACA): Normal. Both A1 segments are
present. Patent anterior communicating artery (a-comm).

--Middle cerebral arteries (MCA): Normal.
IMPRESSION: 1. No acute intracranial abnormality.
2. Old left frontal subcortical infarct and chronic small vessel
disease.
3. Normal intracranial MRA.

## 2021-05-15 ENCOUNTER — Encounter: Payer: Self-pay | Admitting: Physician Assistant

## 2021-05-25 ENCOUNTER — Encounter: Payer: Self-pay | Admitting: Physician Assistant

## 2021-05-25 ENCOUNTER — Other Ambulatory Visit: Payer: Self-pay

## 2021-05-25 ENCOUNTER — Ambulatory Visit (INDEPENDENT_AMBULATORY_CARE_PROVIDER_SITE_OTHER): Payer: Medicare Other | Admitting: Physician Assistant

## 2021-05-25 DIAGNOSIS — F01518 Vascular dementia, unspecified severity, with other behavioral disturbance: Secondary | ICD-10-CM | POA: Diagnosis not present

## 2021-05-25 MED ORDER — MEMANTINE HCL 10 MG PO TABS: ORAL_TABLET | ORAL | 3 refills | Status: DC

## 2021-05-25 NOTE — Progress Notes (Signed)
Assessment/Plan:   Sue Olsen is a very pleasant 81 y.o. 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  MMSE today is 15/30 with deficiencies in orientation, delayed recall, unable to copy pictures.    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 Continue 24/7 care Discussed the importance of regular daily schedule to maintain brain function.  Continue to monitor mood with Psychiatry and PCP Stay active at least 30 minutes at least 3 times a week.  Naps should be scheduled and should be no longer than 60 minutes and should not occur after 2 PM.  Control cardiovascular risk factors  Mediterranean diet is recommended  Start Memantine 10 mg: Take 1 tablet (10 mg at night) for 2 weeks, then increase to 1 tablet (10 mg) twice a day . Side effects discussed  Folllow up in 3  months  Subjective:    The patient is seen in neurologic consultation at the request of Tracey Harries, MD for the evaluation of short-term memory loss.  The patient is accompanied by her daughter who provides most of the history.  This is a 81 y.o. year old RH  female resident of [facility] who has had memory issues for about 5 years.  Patient is in denial of her cognitive status. Daughter reports that the family noticed that over the years she cannot remember her family members names, especially the great grandchildren's, but abe to remember the children's name.  She repeats herself over the last year, and is more disoriented when walking into her room.  She is  also very anxious.  The patient is under the care of a geriatric psychiatrist, as at times she becomes combative.  She is on Zoloft 100 mg and Depakote  daily.  She needs assistance when using the phone, or doing light cooking.  She ambulates without difficulty, denies any falls or head injuries.  She has a tendency to wander off, which has happened 2 times during this year.  She no longer drives.  She lives with her husband at the facility, and most of the time she is with him.  Her mood is very anxious, no apparent depression.  It is unknown if she sleeps well, or has vivid dreams or sleepwalking.  Patient denies hallucinations or paranoia.  However, daughter reports that patient is constantly worried that her daughter is talking about her.  There are no hygiene concerns, she is independent of bathing and dressing.  Medications are administered by [facility], she was missing several doses of them.  Her daughter is in charge of the finances.  Her appetite is good, denies trouble swallowing.  She denies any headaches, double vision, dizziness, focal numbness or tingling, unilateral weakness, tremors or anosmia.  No history of seizures.  Denies urine incontinence, retention, constipation or diarrhea, OSA, alcohol or tobacco.  Family history negative for Alzheimer's disease.  She is retired from office work at age 27.  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 30  Labs October 2022 showed TSH 1.05, vitamin D34.0  Allergies  Allergen Reactions   Cephalexin Other (See Comments)   Cephalosporins Other (See Comments)   Azithromycin Nausea Only    Current Outpatient Medications  Medication Instructions   aspirin 81 mg, Oral, Daily   Calcium Carbonate-Vitamin D (CALCIUM-VITAMIN D) 500-200 MG-UNIT tablet Oral   Cholecalciferol (VITAMIN D3) 400  units CHEW Oral   divalproex (DEPAKOTE SPRINKLE) 125 mg, Oral, 2 times daily   donepezil (ARICEPT) 10 mg, Oral, Daily   fluticasone (FLONASE) 50 MCG/ACT nasal spray Daily   fluticasone (FLONASE) 50 MCG/ACT nasal spray Place into the nose.   memantine (NAMENDA) 10 MG tablet Take 1 tablet (10 mg at night) for 2 weeks, then increase to 1 tablet (10 mg) twice a day   ranitidine (ZANTAC) 150 mg, Oral, 2 times daily   rosuvastatin (CRESTOR) 10 mg, Oral, Daily     VITALS:  There were no vitals filed for this visit.   PHYSICAL EXAM   HEENT:  Normocephalic, atraumatic. The mucous membranes are moist. The superficial temporal arteries are without ropiness or tenderness. Cardiovascular: Regular rate and rhythm. Lungs: Clear to auscultation bilaterally. Neck: There are no carotid bruits noted bilaterally.  NEUROLOGICAL: No flowsheet data found. MMSE - Mini Mental State Exam 05/25/2021  Orientation to time 1  Orientation to Place 2  Registration 3  Attention/ Calculation 2  Recall 0  Language- name 2 objects 2  Language- repeat 0  Language- follow 3 step command 3  Language- read & follow direction 1  Write a sentence 1  Copy design 0  Total score 15    No flowsheet data found.   Orientation:  Alert and oriented to person, not to place or time. No aphasia or dysarthria. Fund of knowledge is reduced.Recent and remote memory impaired.  Attention and concentration are reduced.  Able to name objects and repeat phrases. Delayed recall 0/3 Cranial nerves: There is good facial symmetry. Extraocular muscles are intact and visual fields are full to confrontational testing. Speech is fluent and clear. Soft palate rises symmetrically and there is no tongue deviation. Hearing is intact to conversational tone. Tone: Tone is good throughout. Sensation: Sensation is intact to light touch and pinprick throughout. Vibration is intact at the bilateral big toe.There is no extinction with double simultaneous  stimulation. There is no sensory dermatomal level identified. Coordination: The patient has no difficulty with RAM's or FNF bilaterally. Normal finger to nose  Motor: Strength is 5/5 in the bilateral upper and lower extremities. There is no pronator drift. There are no fasciculations noted. DTR's: Deep tendon reflexes are 2/4 at the bilateral biceps, triceps, brachioradialis, patella and achilles.  Plantar responses are downgoing bilaterally. Gait and Station: The patient is able to ambulate without difficulty.The patient is able to heel toe walk without any difficulty.The patient is able to ambulate in a tandem fashion. The patient is able to stand in the Romberg position.     Thank you for allowing [place] the opportunity to participate in the care of this nice patient. Please do not hesitate to contact us for any questions or concerns.   Total time spent on today's visit was 60 minutes, including both face-to-face time and nonface-to-face time.  Time included that spent on review of records (prior notes available to me/labs/imaging if pertinent), discussing treatment and goals, answering patient's questions and coordinating care.  Cc:  Cleatis Polka., MD  [place]  [facility] 05/26/2021 7:11 AM

## 2021-05-25 NOTE — Patient Instructions (Addendum)
It was a pleasure to see you today at our office.   Recommendations:  Meds: Follow up in  3 months Start Memantine10 mg tablets.  Take 1 tablet at bedtime for 2 weeks, then 1 tablet twice daily.   Side effects include dizziness, headache, diarrhea or constipation.  Call with any questions or concerns.  COntinue the Zoloft and Depakote     RECOMMENDATIONS FOR ALL PATIENTS WITH MEMORY PROBLEMS: 1. Continue to exercise (Recommend 30 minutes of walking everyday, or 3 hours every week) 2. Increase social interactions - continue going to [place] and enjoy social gatherings with friends and family 3. Eat healthy, avoid fried foods and eat more fruits and vegetables 4. Maintain adequate blood pressure, blood sugar, and blood cholesterol level. Reducing the risk of stroke and cardiovascular disease also helps promoting better memory. 5. Avoid stressful situations. Live a simple life and avoid aggravations. Organize your time and prepare for the next day in anticipation. 6. Sleep well, avoid any interruptions of sleep and avoid any distractions in the bedroom that may interfere with adequate sleep quality 7. Avoid sugar, avoid sweets as there is a strong link between excessive sugar intake, diabetes, and cognitive impairment We discussed the Mediterranean diet, which has been shown to help patients reduce the risk of progressive memory disorders and reduces cardiovascular risk. This includes eating fish, eat fruits and green leafy vegetables, nuts like almonds and hazelnuts, walnuts, and also use olive oil. Avoid fast foods and fried foods as much as possible. Avoid sweets and sugar as sugar use has been linked to worsening of memory function.  There is always a concern of gradual progression of memory problems. If this is the case, then we may need to adjust level of care according to patient needs. Support, both to the patient and caregiver, should then be put into place.    The Alzheimers  Association is here all day, every day for people facing Alzheimers disease through our free 24/7 Helpline: [phone]. The Helpline provides reliable information and support to all those who need assistance, such as individuals living with memory loss, Alzheimer's or other dementia, caregivers, health care professionals and the public.  Our highly trained and knowledgeable staff can help you with: Understanding memory loss, dementia and Alzheimer's  Medications and other treatment options  General information about aging and brain health  Skills to provide quality care and to find the best care from professionals  Legal, financial and living-arrangement decisions Our Helpline also features: Confidential care consultation provided by master's level clinicians who can help with decision-making support, crisis assistance and education on issues families face every day  Help in a caller's preferred language using our translation service that features more than 200 languages and dialects  Referrals to local community programs, services and ongoing support     FALL PRECAUTIONS: Be cautious when walking. Scan the area for obstacles that may increase the risk of trips and falls. When getting up in the mornings, sit up at the edge of the bed for a few minutes before getting out of bed. Consider elevating the bed at the head end to avoid drop of blood pressure when getting up. Walk always in a well-lit room (use night lights in the walls). Avoid area rugs or power cords from appliances in the middle of the walkways. Use a walker or a cane if necessary and consider physical therapy for balance exercise. Get your eyesight checked regularly.  FINANCIAL OVERSIGHT: Supervision, especially oversight when making financial decisions  or transactions is also recommended.  HOME SAFETY: Consider the safety of the kitchen when operating appliances like stoves, microwave oven, and blender. Consider having supervision  and share cooking responsibilities until no longer able to participate in those. Accidents with firearms and other hazards in the house should be identified and addressed as well.   ABILITY TO BE LEFT ALONE: If patient is unable to contact 911 operator, consider using LifeLine, or when the need is there, arrange for someone to stay with patients. Smoking is a fire hazard, consider supervision or cessation. Risk of wandering should be assessed by caregiver and if detected at any point, supervision and safe proof recommendations should be instituted.  MEDICATION SUPERVISION: Inability to self-administer medication needs to be constantly addressed. Implement a mechanism to ensure safe administration of the medications.   DRIVING: Regarding driving, in patients with progressive memory problems, driving will be impaired. We advise to have someone else do the driving if trouble finding directions or if minor accidents are reported. Independent driving assessment is available to determine safety of driving.   If you are interested in the driving assessment, you can contact the following:  [employer] in [place] [phone]  [facility] [phone]  [facility] [phone] or [phone]      Mediterranean Diet A Mediterranean diet refers to food and lifestyle choices that are based on the traditions of countries located on the [employer]. This way of eating has been shown to help prevent certain conditions and improve outcomes for people who have chronic diseases, like kidney disease and heart disease. What are tips for following this plan? Lifestyle  Cook and eat meals together with your family, when possible. Drink enough fluid to keep your urine clear or pale yellow. Be physically active every day. This includes: Aerobic exercise like running or swimming. Leisure activities like gardening, walking, or  housework. Get 7-8 hours of sleep each night. If recommended by your health care provider, drink red wine in moderation. This means 1 glass a day for nonpregnant women and 2 glasses a day for men. A glass of wine equals 5 oz (150 mL). Reading food labels  Check the serving size of packaged foods. For foods such as rice and pasta, the serving size refers to the amount of cooked product, not dry. Check the total fat in packaged foods. Avoid foods that have saturated fat or trans fats. Check the ingredients list for added sugars, such as corn syrup. Shopping  At the grocery store, buy most of your food from the areas near the walls of the store. This includes: Fresh fruits and vegetables (produce). Grains, beans, nuts, and seeds. Some of these may be available in unpackaged forms or large amounts (in bulk). Fresh seafood. Poultry and eggs. Low-fat dairy products. Buy whole ingredients instead of prepackaged foods. Buy fresh fruits and vegetables in-season from local farmers markets. Buy frozen fruits and vegetables in resealable bags. If you do not have access to quality fresh seafood, buy precooked frozen shrimp or canned fish, such as tuna, salmon, or sardines. Buy small amounts of raw or cooked vegetables, salads, or olives from the deli or salad bar at your store. Stock your pantry so you always have certain foods on hand, such as olive oil, canned tuna, canned tomatoes, rice, pasta, and beans. Cooking  Cook foods with extra-virgin olive oil instead of using butter or other vegetable oils. Have meat as a side dish, and have vegetables  or grains as your main dish. This means having meat in small portions or adding small amounts of meat to foods like pasta or stew. Use beans or vegetables instead of meat in common dishes like chili or lasagna. Experiment with different cooking methods. Try roasting or broiling vegetables instead of steaming or sauteing them. Add frozen vegetables to soups,  stews, pasta, or rice. Add nuts or seeds for added healthy fat at each meal. You can add these to yogurt, salads, or vegetable dishes. Marinate fish or vegetables using olive oil, lemon juice, garlic, and fresh herbs. Meal planning  Plan to eat 1 vegetarian meal one day each week. Try to work up to 2 vegetarian meals, if possible. Eat seafood 2 or more times a week. Have healthy snacks readily available, such as: Vegetable sticks with hummus. Greek yogurt. Fruit and nut trail mix. Eat balanced meals throughout the week. This includes: Fruit: 2-3 servings a day Vegetables: 4-5 servings a day Low-fat dairy: 2 servings a day Fish, poultry, or lean meat: 1 serving a day Beans and legumes: 2 or more servings a week Nuts and seeds: 1-2 servings a day Whole grains: 6-8 servings a day Extra-virgin olive oil: 3-4 servings a day Limit red meat and sweets to only a few servings a month What are my food choices? Mediterranean diet Recommended Grains: Whole-grain pasta. Brown rice. Bulgar wheat. Polenta. Couscous. Whole-wheat bread. Orpah Cobb. Vegetables: Artichokes. Beets. Broccoli. Cabbage. Carrots. Eggplant. Green beans. Chard. Kale. Spinach. Onions. Leeks. Peas. Squash. Tomatoes. Peppers. Radishes. Fruits: Apples. Apricots. Avocado. Berries. Bananas. Cherries. Dates. Figs. Grapes. Lemons. Melon. Oranges. Peaches. Plums. Pomegranate. Meats and other protein foods: Beans. Almonds. Sunflower seeds. Pine nuts. Peanuts. Cod. Salmon. Scallops. Shrimp. Tuna. Tilapia. Clams. Oysters. Eggs. Dairy: Low-fat milk. Cheese. Greek yogurt. Beverages: Water. Red wine. Herbal tea. Fats and oils: Extra virgin olive oil. Avocado oil. Grape seed oil. Sweets and desserts: [place] yogurt with honey. Baked apples. Poached pears. Trail mix. Seasoning and other foods: Basil. Cilantro. Coriander. Cumin. Mint. Parsley. Sage. Rosemary. Tarragon. Garlic. Oregano. Thyme. Pepper. Balsalmic vinegar. Tahini. Hummus. Tomato  sauce. Olives. Mushrooms. Limit these Grains: Prepackaged pasta or rice dishes. Prepackaged cereal with added sugar. Vegetables: Deep fried potatoes (french fries). Fruits: Fruit canned in syrup. Meats and other protein foods: Beef. Pork. Lamb. Poultry with skin. Hot dogs. Tomasa Blase. Dairy: Ice cream. Sour cream. Whole milk. Beverages: Juice. Sugar-sweetened soft drinks. Beer. Liquor and spirits. Fats and oils: Butter. Canola oil. Vegetable oil. Beef fat (tallow). Lard. Sweets and desserts: Cookies. Cakes. Pies. Candy. Seasoning and other foods: Mayonnaise. Premade sauces and marinades. The items listed may not be a complete list. Talk with your dietitian about what dietary choices are right for you. Summary The Mediterranean diet includes both food and lifestyle choices. Eat a variety of fresh fruits and vegetables, beans, nuts, seeds, and whole grains. Limit the amount of red meat and sweets that you eat. Talk with your health care provider about whether it is safe for you to drink red wine in moderation. This means 1 glass a day for nonpregnant women and 2 glasses a day for men. A glass of wine equals 5 oz (150 mL). This information is not intended to replace advice given to you by your health care provider. Make sure you discuss any questions you have with your health care provider. Document Released: 11/27/2015 Document Revised: 12/30/2015 Document Reviewed: 11/27/2015 Elsevier Interactive Patient Education  2017 [employer].

## 2021-08-22 NOTE — Progress Notes (Signed)
? ? ? ?Assessment/Plan:  ? ?Sue Olsen is a very pleasant 81 y.o. 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 Last MMSE on 06/02/21 was 15/30. She is on donepezil 10 mg daily and memantine 10 mg bid, tolerating them well. She is on Depakote and Zoloft for mood control as per PCP.  ? ? ? Recommendations:  ? 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 ?Discussed safety both in and out of the home. Continue 24/7 care ?Discussed the importance of regular daily schedule to maintain brain function.  ?Continue to monitor mood with PCP. Continue Depakote and Zoloft ?Stay active at least 30 minutes at least 3 times a week.  ?Naps should be scheduled and should be no longer than 60 minutes and should not occur after 2 PM.  ?Control cardiovascular risk factors  ?Mediterranean diet is recommended  ?Continue Memantine 10 mg twice a day . Side effects discussed  ?Folllow up in 6 months ? ?Subjective:  ? ?How is your memory, any changes?: Since last visit, patient reports that memory is about the same.  She continues to have difficulty remembering the names of family members especially her grandchildren's, although she is able to remember her children's names. ?Patient lives with: She lives with her husband at [facility], she likes to participate in activities in there, such as exercising, belonging to "walking", enjoys music and movies, she does not like being go. ?repeats oneself?  Endorsed.   ?Disoriented when walking into a room?  Occasionally, she does not recognize the place she is him.  For example, today she did not recognize his office. ?Leaving objects in unusual places?  Denies ?Ambulates  with difficulty?  Denies ?Recent falls?   Denies ?Any head injuries?  Denies ?History of seizures?  Denies ?Wandering behavior?  Denies, has not happened since her last visit ?Patient drives?   Patient no longer drives   ?Any mood changes such irritability agitation?  Her daughter reports since being in on memantine, along with Zoloft and Depakote, she seems to be calmer at night she becomes sleepier. ?Any depression: Denies ?Hallucinations?  Denies ?Paranoia?  Denies ?Patient reports that he sleeps well without vivid dreams, REM behavior or sleepwalking ?Any hygiene concerns?  Denies ?Independent of bathing and dressing?  Daughter helps with dressing and bathing ?Does the patient needs help with medications?  Endorsed.  These are administered by [facility]  ?Who is in charge of the finances?  Daughter is in charge  ?any changes in appetite?  Her appetite is good ?Patient have trouble swallowing?  Denies ?Does the patient cook?  She does "very light cooking with assistance ". ?Any kitchen accidents such as leaving the stove on?  Denies ?Any headaches?  Denies ?The double vision?  Denies ?Any focal numbness or tingling?  Denies ?Back pain?  Denies ?Unilateral weakness?  Denies ?Any tremors?  Mild bilateral hand tremors, present since Depakote. ?Any history of anosmia?  Denies ?Any incontinence of urine?  Denies ?Any bowel dysfunction?  Denies ? ? ?Initial VIsit 05/26/21 The patient is seen in neurologic consultation at the request of Clelia Croft Netta Corrigan., MD for the evaluation of short-term memory loss.  The patient is accompanied by her daughter who provides most of the history.  This is a  81 y.o. year old RH  female resident of [facility] who has had memory issues for about 5 years.  Patient is in denial of her cognitive status. Daughter reports that the family noticed that over the years she cannot remember her family members names, especially the great grandchildren's, but able to remember the children's name.  She repeats herself over the last  year, and is more disoriented when walking into her room.  She is also very anxious.  The patient is under the care of a geriatric psychiatrist, as at times she becomes combative.  She is on Zoloft 100 mg and Depakote  daily.  She needs assistance when using the phone, or doing light cooking.  She ambulates without difficulty, denies any falls or head injuries.  She has a tendency to wander off, which has happened 2 times during this year.  She no longer drives.  She lives with her husband at the facility, and most of the time she is with him.  Her mood is very anxious, no apparent depression.  It is unknown if she sleeps well, or has vivid dreams or sleepwalking.  Patient denies hallucinations or paranoia.  However, daughter reports that patient is constantly worried that her daughter is talking about her.  There are no hygiene concerns, she is independent of bathing and dressing.  Medications are administered by [facility], she was missing several doses of them.  Her daughter is in charge of the finances.  Her appetite is good, denies trouble swallowing.  She denies any headaches, double vision, dizziness, focal numbness or tingling, unilateral weakness, tremors or anosmia.  No history of seizures.  Denies urine incontinence, retention, constipation or diarrhea, OSA, alcohol or tobacco.  Family history negative for Alzheimer's disease.  She is retired from office work at age 81. ? 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 ?Labs October 2022 showed TSH 1.05, vitamin D34.0 ? ?Allergies  ?Allergen Reactions  ? Cephalexin Other (See Comments)  ? Cephalosporins Other (See Comments)  ? Azithromycin Nausea Only  ? ? ?Current Outpatient Medications  ?Medication Instructions  ? aspirin 81 mg, Oral, Daily  ? Calcium Carbonate-Vitamin D (CALCIUM-VITAMIN D)  500-200 MG-UNIT tablet Oral  ? Cholecalciferol (VITAMIN D3) 400 units CHEW Oral  ? divalproex (DEPAKOTE SPRINKLE) 125 mg, Oral, 2 times daily  ? donepezil (ARICEPT) 10 mg, Oral, Daily  ? fluticasone (FLONASE) 50 MCG/ACT nasal spray Each Nare, Daily  ? fluticasone (FLONASE) 50 MCG/ACT nasal spray Nasal  ? memantine (NAMENDA) 10 MG tablet TAKE ONE TABLET TWICE DAILY  ? ranitidine (ZANTAC) 150 mg, Oral, 2 times daily  ? rosuvastatin (CRESTOR) 10 mg, Oral, Daily  ? ? ? ?VITALS:   ?Vitals:  ? 08/24/21 1036  ?BP: (!) 179/72  ?Pulse: 62  ?Resp: 20  ?SpO2: 96%  ?Weight: 118 lb (53.5 kg)  ?Height: 5\' 1"  (1.549 m)  ? ? ? ?PHYSICAL EXAM  ? ?HEENT:  Normocephalic, atraumatic. The mucous membranes are moist. The superficial temporal arteries are without ropiness or tenderness. Anxious appearing, no hypomimia noted. ?Cardiovascular: Regular rate and rhythm. ?Lungs: Clear to auscultation bilaterally. ?Neck: There are no carotid bruits noted bilaterally. ? ?NEUROLOGICAL: ?   ? View : No data to display.  ?  ?  ?  ? ? ?  05/25/2021  ? 10:00 AM  ?MMSE -  Mini Mental State Exam  ?Orientation to time 1  ?Orientation to Place 2  ?Registration 3  ?Attention/ Calculation 2  ?Recall 0  ?Language- name 2 objects 2  ?Language- repeat 0  ?Language- follow 3 step command 3  ?Language- read & follow direction 1  ?Write a sentence 1  ?Copy design 0  ?Total score 15  ?  ?   ? View : No data to display.  ?  ?  ?  ?  ? ?Orientation:  Alert and oriented to person, not to place or time. No aphasia or dysarthria. Fund of knowledge is reduced. Recent and remote memory impaired.  Attention and concentration are reduced.  Able to name objects and repeat phrases. Delayed recall 0/3 ?Cranial nerves: There is good facial symmetry. Extraocular muscles are intact and visual fields are full to confrontational testing. Speech is fluent and clear. Soft palate rises symmetrically and there is no tongue deviation. Hearing is intact to conversational tone. ?Tone: Tone  is good throughout. No cogwheeling ?Sensation: Sensation is intact to light touch and pinprick throughout. Vibration is intact at the bilateral big toe.There is no extinction with double simultaneous stimulatio

## 2021-08-24 ENCOUNTER — Other Ambulatory Visit: Payer: Self-pay | Admitting: Physician Assistant

## 2021-08-24 ENCOUNTER — Ambulatory Visit (INDEPENDENT_AMBULATORY_CARE_PROVIDER_SITE_OTHER): Payer: Medicare Other | Admitting: Physician Assistant

## 2021-08-24 ENCOUNTER — Encounter: Payer: Self-pay | Admitting: Physician Assistant

## 2021-08-24 VITALS — BP 179/72 | HR 62 | Resp 20 | Ht 61.0 in | Wt 118.0 lb

## 2021-08-24 DIAGNOSIS — F01518 Vascular dementia, unspecified severity, with other behavioral disturbance: Secondary | ICD-10-CM | POA: Diagnosis not present

## 2021-08-24 MED ORDER — DONEPEZIL HCL 10 MG PO TABS: 10.0000 mg | ORAL_TABLET | Freq: Every day | ORAL | 11 refills | Status: DC

## 2021-08-24 MED ORDER — MEMANTINE HCL 10 MG PO TABS: ORAL_TABLET | ORAL | 11 refills | Status: DC

## 2021-08-24 NOTE — Patient Instructions (Addendum)
It was a pleasure to see you today at our office.  ? ?Recommendations: ? ?Meds: ?Follow up in 6 months ?Continue Memantine10 mg tablets.  Take 1 tablet at bedtime for 2 weeks, then 1 tablet twice daily.   Side effects include dizziness, headache, diarrhea or constipation.  Call with any questions or concerns.  ?Continue the Zoloft and Depakote, monitor with family doctor  ? ? ?RECOMMENDATIONS FOR ALL PATIENTS WITH MEMORY PROBLEMS: ?1. Continue to exercise (Recommend 30 minutes of walking everyday, or 3 hours every week) ?2. Increase social interactions - continue going to [place] and enjoy social gatherings with friends and family ?3. Eat healthy, avoid fried foods and eat more fruits and vegetables ?4. Maintain adequate blood pressure, blood sugar, and blood cholesterol level. Reducing the risk of stroke and cardiovascular disease also helps promoting better memory. ?5. Avoid stressful situations. Live a simple life and avoid aggravations. Organize your time and prepare for the next day in anticipation. ?6. Sleep well, avoid any interruptions of sleep and avoid any distractions in the bedroom that may interfere with adequate sleep quality ?7. Avoid sugar, avoid sweets as there is a strong link between excessive sugar intake, diabetes, and cognitive impairment ?We discussed the Mediterranean diet, which has been shown to help patients reduce the risk of progressive memory disorders and reduces cardiovascular risk. This includes eating fish, eat fruits and green leafy vegetables, nuts like almonds and hazelnuts, walnuts, and also use olive oil. Avoid fast foods and fried foods as much as possible. Avoid sweets and sugar as sugar use has been linked to worsening of memory function. ? ?There is always a concern of gradual progression of memory problems. If this is the case, then we may need to adjust level of care according to patient needs. Support, both to the patient and caregiver, should then be put into place.   ? ? ?The Alzheimer?s Association is here all day, every day for people facing Alzheimer?s disease through our free 24/7 Helpline: [phone]. The Helpline provides reliable information and support to all those who need assistance, such as individuals living with memory loss, Alzheimer's or other dementia, caregivers, health care professionals and the public.  ?Our highly trained and knowledgeable staff can help you with: ?Understanding memory loss, dementia and Alzheimer's  ?Medications and other treatment options  ?General information about aging and brain health  ?Skills to provide quality care and to find the best care from professionals  ?Legal, financial and living-arrangement decisions ?Our Helpline also features: ?Confidential care consultation provided by master's level clinicians who can help with decision-making support, crisis assistance and education on issues families face every day  ?Help in a caller's preferred language using our translation service that features more than 200 languages and dialects  ?Referrals to local community programs, services and ongoing support ? ? ? ? ?FALL PRECAUTIONS: Be cautious when walking. Scan the area for obstacles that may increase the risk of trips and falls. When getting up in the mornings, sit up at the edge of the bed for a few minutes before getting out of bed. Consider elevating the bed at the head end to avoid drop of blood pressure when getting up. Walk always in a well-lit room (use night lights in the walls). Avoid area rugs or power cords from appliances in the middle of the walkways. Use a walker or a cane if necessary and consider physical therapy for balance exercise. Get your eyesight checked regularly. ? ?FINANCIAL OVERSIGHT: Supervision, especially oversight when making  financial decisions or transactions is also recommended. ? ?HOME SAFETY: Consider the safety of the kitchen when operating appliances like stoves, microwave oven, and blender.  Consider having supervision and share cooking responsibilities until no longer able to participate in those. Accidents with firearms and other hazards in the house should be identified and addressed as well. ? ? ?ABILITY TO BE LEFT ALONE: If patient is unable to contact 911 operator, consider using LifeLine, or when the need is there, arrange for someone to stay with patients. Smoking is a fire hazard, consider supervision or cessation. Risk of wandering should be assessed by caregiver and if detected at any point, supervision and safe proof recommendations should be instituted. ? ?MEDICATION SUPERVISION: Inability to self-administer medication needs to be constantly addressed. Implement a mechanism to ensure safe administration of the medications. ? ? ?DRIVING: Regarding driving, in patients with progressive memory problems, driving will be impaired. We advise to have someone else do the driving if trouble finding directions or if minor accidents are reported. Independent driving assessment is available to determine safety of driving. ? ? ?If you are interested in the driving assessment, you can contact the following: ? ?[facility] [employer] [facility] in [place] [phone] ? ?[facility] [phone] ? ?[facility] [phone] ? ?[facility] [phone] or [phone] ? ?  ? ? ?Mediterranean Diet ?A Mediterranean diet refers to food and lifestyle choices that are based on the traditions of countries located on the [employer]. This way of eating has been shown to help prevent certain conditions and improve outcomes for people who have chronic diseases, like kidney disease and heart disease. ?What are tips for following this plan? ?Lifestyle  ?Cook and eat meals together with your family, when possible. ?Drink enough fluid to keep your urine clear or pale yellow. ?Be physically active every day. This includes: ?Aerobic exercise like running or swimming. ?Leisure activities  like gardening, walking, or housework. ?Get 7-8 hours of sleep each night. ?If recommended by your health care provider, drink red wine in moderation. This means 1 glass a day for nonpregnant women and 2 glasses a day for men. A glass of wine equals 5 oz (150 mL). ?Reading food labels  ?Check the serving size of packaged foods. For foods such as rice and pasta, the serving size refers to the amount of cooked product, not dry. ?Check the total fat in packaged foods. Avoid foods that have saturated fat or trans fats. ?Check the ingredients list for added sugars, such as corn syrup. ?Shopping  ?At the grocery store, buy most of your food from the areas near the walls of the store. This includes: ?Fresh fruits and vegetables (produce). ?Grains, beans, nuts, and seeds. Some of these may be available in unpackaged forms or large amounts (in bulk). ?Fresh seafood. ?Poultry and eggs. ?Low-fat dairy products. ?Buy whole ingredients instead of prepackaged foods. ?Buy fresh fruits and vegetables in-season from local farmers markets. ?Buy frozen fruits and vegetables in resealable bags. ?If you do not have access to quality fresh seafood, buy precooked frozen shrimp or canned fish, such as tuna, salmon, or sardines. ?Buy small amounts of raw or cooked vegetables, salads, or olives from the deli or salad bar at your store. ?Stock your pantry so you always have certain foods on hand, such as olive oil, canned tuna, canned tomatoes, rice, pasta, and beans. ?Cooking  ?Cook foods with extra-virgin olive oil instead of using butter or other vegetable oils. ?Have meat as a side dish, and  have vegetables or grains as your main dish. This means having meat in small portions or adding small amounts of meat to foods like pasta or stew. ?Use beans or vegetables instead of meat in common dishes like chili or lasagna. ?Experiment with different cooking methods. Try roasting or broiling vegetables instead of steaming or saut?eing them. ?Add  frozen vegetables to soups, stews, pasta, or rice. ?Add nuts or seeds for added healthy fat at each meal. You can add these to yogurt, salads, or vegetable dishes. ?Marinate fish or vegetables using olive oil, l

## 2021-11-11 ENCOUNTER — Other Ambulatory Visit: Payer: Self-pay | Admitting: Physician Assistant

## 2022-01-16 ENCOUNTER — Other Ambulatory Visit: Payer: Self-pay

## 2022-01-16 ENCOUNTER — Emergency Department (HOSPITAL_COMMUNITY): Payer: Medicare Other

## 2022-01-16 ENCOUNTER — Emergency Department (HOSPITAL_COMMUNITY)
Admission: EM | Admit: 2022-01-16 | Discharge: 2022-01-16 | Disposition: A | Discharge status: Home / Self Care | Payer: Medicare Other | Attending: Emergency Medicine | Admitting: Emergency Medicine

## 2022-01-16 ENCOUNTER — Encounter (HOSPITAL_COMMUNITY): Payer: Self-pay | Admitting: Emergency Medicine

## 2022-01-16 DIAGNOSIS — S0081XA Abrasion of other part of head, initial encounter: Secondary | ICD-10-CM

## 2022-01-16 DIAGNOSIS — S0181XA Laceration without foreign body of other part of head, initial encounter: Secondary | ICD-10-CM | POA: Insufficient documentation

## 2022-01-16 DIAGNOSIS — R001 Bradycardia, unspecified: Secondary | ICD-10-CM | POA: Diagnosis not present

## 2022-01-16 DIAGNOSIS — F0390 Unspecified dementia without behavioral disturbance: Secondary | ICD-10-CM | POA: Diagnosis not present

## 2022-01-16 DIAGNOSIS — D696 Thrombocytopenia, unspecified: Secondary | ICD-10-CM | POA: Diagnosis not present

## 2022-01-16 DIAGNOSIS — I10 Essential (primary) hypertension: Secondary | ICD-10-CM | POA: Insufficient documentation

## 2022-01-16 DIAGNOSIS — S0512XA Contusion of eyeball and orbital tissues, left eye, initial encounter: Secondary | ICD-10-CM | POA: Diagnosis not present

## 2022-01-16 DIAGNOSIS — W01198A Fall on same level from slipping, tripping and stumbling with subsequent striking against other object, initial encounter: Secondary | ICD-10-CM | POA: Diagnosis not present

## 2022-01-16 DIAGNOSIS — S0990XA Unspecified injury of head, initial encounter: Secondary | ICD-10-CM | POA: Diagnosis present

## 2022-01-16 DIAGNOSIS — W19XXXA Unspecified fall, initial encounter: Secondary | ICD-10-CM

## 2022-01-16 LAB — CBC WITH DIFFERENTIAL/PLATELET
Abs Immature Granulocytes: 0.02 10*3/uL (ref 0.00–0.07)
Basophils Absolute: 0 10*3/uL (ref 0.0–0.1)
Basophils Relative: 0 %
Eosinophils Absolute: 0 10*3/uL (ref 0.0–0.5)
Eosinophils Relative: 0 %
HCT: 37 % (ref 36.0–46.0)
Hemoglobin: 12.3 g/dL (ref 12.0–15.0)
Immature Granulocytes: 0 %
Lymphocytes Relative: 29 %
Lymphs Abs: 2.4 10*3/uL (ref 0.7–4.0)
MCH: 30.7 pg (ref 26.0–34.0)
MCHC: 33.2 g/dL (ref 30.0–36.0)
MCV: 92.3 fL (ref 80.0–100.0)
Monocytes Absolute: 0.5 10*3/uL (ref 0.1–1.0)
Monocytes Relative: 6 %
Neutro Abs: 5.2 10*3/uL (ref 1.7–7.7)
Neutrophils Relative %: 65 %
Platelets: 136 10*3/uL — ABNORMAL LOW (ref 150–400)
RBC: 4.01 MIL/uL (ref 3.87–5.11)
RDW: 12.5 % (ref 11.5–15.5)
WBC: 8.1 10*3/uL (ref 4.0–10.5)
nRBC: 0 % (ref 0.0–0.2)

## 2022-01-16 LAB — URINALYSIS, ROUTINE W REFLEX MICROSCOPIC
Bilirubin Urine: NEGATIVE
Glucose, UA: NEGATIVE mg/dL
Ketones, ur: 5 mg/dL — AB
Leukocytes,Ua: NEGATIVE
Nitrite: NEGATIVE
Protein, ur: NEGATIVE mg/dL
Specific Gravity, Urine: 1.009 (ref 1.005–1.030)
pH: 8 (ref 5.0–8.0)

## 2022-01-16 LAB — BASIC METABOLIC PANEL
Anion gap: 9 (ref 5–15)
BUN: 13 mg/dL (ref 8–23)
CO2: 26 mmol/L (ref 22–32)
Calcium: 9.3 mg/dL (ref 8.9–10.3)
Chloride: 108 mmol/L (ref 98–111)
Creatinine, Ser: 0.65 mg/dL (ref 0.44–1.00)
GFR, Estimated: >60 mL/min (ref >60)
Glucose, Bld: 94 mg/dL (ref 70–99)
Potassium: 3.5 mmol/L (ref 3.5–5.1)
Sodium: 143 mmol/L (ref 135–145)

## 2022-01-16 NOTE — ED Provider Notes (Addendum)
[facility]-EMERGENCY DEPT Provider Note   CSN: [id number] Arrival date & time: 01/16/22  1214     History Chief Complaint  Patient presents with   Sue Olsen   Sue Olsen is a 81 y.o. female F with h/o dementia and HTN presents to the ED for evaluation of fall. Her husband is at bedside with her and provides the majority of the story. The patient had a mechanical fall today where she didn't step over a concrete curb high enough and fell landing her head on the sidewalk. He denies any LOC or that the patient is on any blood thinners. The patient mentions that her head hurts where her bruising is, but otherwise she denies any blurry vision, chest pain, abdominal pain, back pain, extremity pain, or SOB. This is limited however 2/2 her dementia.    Fall       Home Medications Prior to Admission medications   Medication Sig Start Date End Date Taking? Authorizing Provider  divalproex (DEPAKOTE) 250 MG DR tablet Take 250 mg by mouth 2 (two) times daily. 11/19/21  Yes [provider]  donepezil (ARICEPT) 10 MG tablet Take 1 tablet (10 mg total) by mouth daily. 08/24/21  Yes Wertman, Sung AmabileSara E, PA-C  memantine (NAMENDA) 10 MG tablet TAKE ONE TABLET TWICE DAILY Patient taking differently: Take 10 mg by mouth 2 (two) times daily. 11/11/21  Yes Marcos EkeWertman, Sara E, PA-C  rosuvastatin (CRESTOR) 10 MG tablet Take 10 mg by mouth daily. 04/20/21  Yes [provider]  sertraline (ZOLOFT) 100 MG tablet Take 100 mg by mouth daily. 11/19/21  Yes [provider]      Allergies    Cephalexin, Cephalosporins, and Azithromycin    Review of Systems   Review of Systems  Unable to perform ROS: Dementia    Physical Exam Updated Vital Signs BP (!) 180/90 (BP Location: Right Arm)   Pulse (!) 58   Temp 98.3 F (36.8 C) (Oral)   Resp 16   SpO2 99%  Physical Exam Vitals and nursing note reviewed.  Constitutional:      General: She is not in acute distress.     Appearance: She is not ill-appearing or toxic-appearing.     Comments: Pleasantly demented.   HENT:     Head:     Comments: Bruising noted around the left orbit. No step offs or deformities. There is a small superficial abrasion noted superior her eyebrow. No other facial tenderness to palpation.     Right Ear: Tympanic membrane, ear canal and external ear normal.     Left Ear: Tympanic membrane, ear canal and external ear normal.     Ears:     Comments: No hemotypanums.     Nose: Nose normal.     Comments: No septal hematoma.     Mouth/Throat:     Mouth: Mucous membranes are moist.  Eyes:     Pupils: Pupils are equal, round, and reactive to light.  Cardiovascular:     Rate and Rhythm: Bradycardia present.  Pulmonary:     Effort: Pulmonary effort is normal. No respiratory distress.     Breath sounds: Normal breath sounds.  Abdominal:     General: Bowel sounds are normal.     Palpations: Abdomen is soft.     Tenderness: There is no abdominal tenderness. There is no guarding or rebound.  Musculoskeletal:        General: No tenderness or deformity.     Cervical back: Normal  range of motion. No tenderness.     Comments: No signs of trauma noted to the upper or lower bilateral extremities. She is not tender to palpation. She has palpable pulses throughout. Compartments are soft. No midline or paraspinal cervical, thoracic, or lumbar tenderness to palpation.   Neurological:     General: No focal deficit present.     Mental Status: She is alert. Mental status is at baseline.     Comments: Pt oriented x1, this is baseline for her per family. Neuro exam difficult as the patient has a hard time follow directions. Her cranial nerves are grossly intact. She is moving all extremities. She reports that her sensation is intact. She is ambulatory.         ED Results / Procedures / Treatments   Labs (all labs ordered are listed, but only abnormal results are displayed) Labs Reviewed  CBC  WITH DIFFERENTIAL/PLATELET - Abnormal; Notable for the following components:      Result Value   Platelets 136 (*)    All other components within normal limits  URINALYSIS, ROUTINE W REFLEX MICROSCOPIC - Abnormal; Notable for the following components:   Hgb urine dipstick SMALL (*)    Ketones, ur 5 (*)    Bacteria, UA RARE (*)    All other components within normal limits  BASIC METABOLIC PANEL    EKG None  Radiology DG Hips Bilat W or Wo Pelvis 3-4 Views  Result Date: 01/16/2022 CLINICAL DATA:  Pt had a mechanical fall, bruise on left eye- pt denies hip pain but son is concerned since she fell EXAM: DG HIP (WITH OR WITHOUT PELVIS) 3-4V BILAT COMPARISON:  None Available. FINDINGS: There is no evidence of hip fracture or dislocation. No acute displaced fracture or diastasis of the bones of the pelvis. Bilateral hip moderate degenerative changes. IMPRESSION: Negative. Electronically Signed   By: Tish Frederickson M.D.   On: 01/16/2022 16:34   CT Head Wo Contrast  Result Date: 01/16/2022 CLINICAL DATA:  Fall, head injury, injury to left temporal area and orbit EXAM: CT HEAD WITHOUT CONTRAST CT MAXILLOFACIAL WITHOUT CONTRAST CT CERVICAL SPINE WITHOUT CONTRAST TECHNIQUE: Multidetector CT imaging of the head, cervical spine, and maxillofacial structures were performed using the standard protocol without intravenous contrast. Multiplanar CT image reconstructions of the cervical spine and maxillofacial structures were also generated. RADIATION DOSE REDUCTION: This exam was performed according to the departmental dose-optimization program which includes automated exposure control, adjustment of the mA and/or kV according to patient size and/or use of iterative reconstruction technique. COMPARISON:  None Available. FINDINGS: CT HEAD FINDINGS Brain: No evidence of acute infarction, hemorrhage, hydrocephalus, extra-axial collection or mass lesion/mass effect. Periventricular and deep white matter hypodensity.  Mild global cerebral volume loss. Vascular: No hyperdense vessel or unexpected calcification. CT FACIAL BONES FINDINGS Skull: Normal. Negative for fracture or focal lesion. Facial bones: No displaced fractures or dislocations. Sinuses/Orbits: No acute finding. Other: Soft tissue laceration and contusion to the left temporal and left cheek (series 3, image 28). CT CERVICAL SPINE FINDINGS Alignment: Straightening of the normal cervical lordosis. Skull base and vertebrae: No acute fracture. No primary bone lesion or focal pathologic process. Soft tissues and spinal canal: No prevertebral fluid or swelling. No visible canal hematoma. Disc levels: Mild multilevel disc space height loss and osteophytosis. Upper chest: Negative. Other: None. IMPRESSION: 1. No acute intracranial pathology. 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 2. No displaced fractures or dislocations of the facial bones. 3. Soft  tissue laceration and contusion to the left temporal and left cheek. 4. No fracture or subluxation of the cervical spine. Electronically Signed   By: Jearld Lesch M.D.   On: 01/16/2022 14:27   CT Maxillofacial Wo Contrast  Result Date: 01/16/2022 CLINICAL DATA:  Fall, head injury, injury to left temporal area and orbit EXAM: CT HEAD WITHOUT CONTRAST CT MAXILLOFACIAL WITHOUT CONTRAST CT CERVICAL SPINE WITHOUT CONTRAST TECHNIQUE: Multidetector CT imaging of the head, cervical spine, and maxillofacial structures were performed using the standard protocol without intravenous contrast. Multiplanar CT image reconstructions of the cervical spine and maxillofacial structures were also generated. RADIATION DOSE REDUCTION: This exam was performed according to the departmental dose-optimization program which includes automated exposure control, adjustment of the mA and/or kV according to patient size and/or use of iterative reconstruction technique. COMPARISON:  None Available. FINDINGS: CT HEAD FINDINGS  Brain: No evidence of acute infarction, hemorrhage, hydrocephalus, extra-axial collection or mass lesion/mass effect. Periventricular and deep white matter hypodensity. Mild global cerebral volume loss. Vascular: No hyperdense vessel or unexpected calcification. CT FACIAL BONES FINDINGS Skull: Normal. Negative for fracture or focal lesion. Facial bones: No displaced fractures or dislocations. Sinuses/Orbits: No acute finding. Other: Soft tissue laceration and contusion to the left temporal and left cheek (series 3, image 28). CT CERVICAL SPINE FINDINGS Alignment: Straightening of the normal cervical lordosis. Skull base and vertebrae: No acute fracture. No primary bone lesion or focal pathologic process. Soft tissues and spinal canal: No prevertebral fluid or swelling. No visible canal hematoma. Disc levels: Mild multilevel disc space height loss and osteophytosis. Upper chest: Negative. Other: None. IMPRESSION: 1. No acute intracranial pathology. 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 2. No displaced fractures or dislocations of the facial bones. 3. Soft tissue laceration and contusion to the left temporal and left cheek. 4. No fracture or subluxation of the cervical spine. Electronically Signed   By: Jearld Lesch M.D.   On: 01/16/2022 14:27   CT Cervical Spine Wo Contrast  Result Date: 01/16/2022 CLINICAL DATA:  Fall, head injury, injury to left temporal area and orbit EXAM: CT HEAD WITHOUT CONTRAST CT MAXILLOFACIAL WITHOUT CONTRAST CT CERVICAL SPINE WITHOUT CONTRAST TECHNIQUE: Multidetector CT imaging of the head, cervical spine, and maxillofacial structures were performed using the standard protocol without intravenous contrast. Multiplanar CT image reconstructions of the cervical spine and maxillofacial structures were also generated. RADIATION DOSE REDUCTION: This exam was performed according to the departmental dose-optimization program which includes automated exposure  control, adjustment of the mA and/or kV according to patient size and/or use of iterative reconstruction technique. COMPARISON:  None Available. FINDINGS: CT HEAD FINDINGS Brain: No evidence of acute infarction, hemorrhage, hydrocephalus, extra-axial collection or mass lesion/mass effect. Periventricular and deep white matter hypodensity. Mild global cerebral volume loss. Vascular: No hyperdense vessel or unexpected calcification. CT FACIAL BONES FINDINGS Skull: Normal. Negative for fracture or focal lesion. Facial bones: No displaced fractures or dislocations. Sinuses/Orbits: No acute finding. Other: Soft tissue laceration and contusion to the left temporal and left cheek (series 3, image 28). CT CERVICAL SPINE FINDINGS Alignment: Straightening of the normal cervical lordosis. Skull base and vertebrae: No acute fracture. No primary bone lesion or focal pathologic process. Soft tissues and spinal canal: No prevertebral fluid or swelling. No visible canal hematoma. Disc levels: Mild multilevel disc space height loss and osteophytosis. Upper chest: Negative. Other: None. IMPRESSION: 1. No acute intracranial pathology. 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 2. No displaced  fractures or dislocations of the facial bones. 3. Soft tissue laceration and contusion to the left temporal and left cheek. 4. No fracture or subluxation of the cervical spine. Electronically Signed   By: Delanna Ahmadi M.D.   On: 01/16/2022 14:27    Procedures .Marland KitchenLaceration Repair  Date/Time: 01/16/2022 5:07 PM  Performed by: Sherrell Puller, PA-C Authorized by: Sherrell Puller, PA-C   Consent:    Consent obtained:  Verbal   Consent given by:  Patient   Risks, benefits, and alternatives were discussed: yes     Risks discussed:  Infection, need for additional repair, pain, poor cosmetic result and poor wound healing   Alternatives discussed:  No treatment Universal protocol:    Procedure explained and  questions answered to patient or proxy's satisfaction: yes     Imaging studies available: yes     Patient identity confirmed:  Verbally with patient Anesthesia:    Anesthesia method:  None Pre-procedure details:    Preparation:  Patient was prepped and draped in usual sterile fashion and imaging obtained to evaluate for foreign bodies Exploration:    Wound exploration: wound explored through full range of motion and entire depth of wound visualized     Contaminated: no   Treatment:    Area cleansed with:  Soap and water   Amount of cleaning:  Standard   Irrigation solution:  Sterile saline Skin repair:    Repair method:  Tissue adhesive Repair type:    Repair type:  Simple Post-procedure details:    Dressing:  Sterile dressing   Procedure completion:  Tolerated well, no immediate complications    Medications Ordered in ED Medications - No data to display  ED Course/ Medical Decision Making/ A&P Clinical Course as of 01/16/22 2105  Sat Jan 16, 2022  1334 Stable 81 YOF with fall and head injury. On [place]. At baseline. Abrasion to left eyebrow Trauma, reassess likely DC. [CC]  1335 No AC. [CC]  2104 Now able to ambulate better after supportive care. Planning to get walker and continue working with PCP. Family prefering DC over TOC. [CC]    Clinical Course User Index [CC] Tretha Sciara, MD                           Medical Decision Making Amount and/or Complexity of Data Reviewed Labs: ordered. Radiology: ordered.   81 year old female presents the Emergency Department for evaluation after mechanical fall today.  Differential diagnosis includes was not limited to maxillofacial fracture, brain bleed, skull fracture, cervical fracture, contusion, concussion.  Vital signs show a elevated blood pressure, slight bradycardia otherwise afebrile, satting well room air without increased work of breathing.  Physical exam as noted above.  We will order labs, urinalysis, and  imaging.  I independent reviewed and interpreted the patient's labs.  BMP shows no electrolyte abnormality.  CBC shows some thrombocytopenia at 136 although I do not have any prior labs to compare this to.  No anemia or leukocytosis present.  Urinalysis shows small amount of hemoglobin, 5 ketones, rare bacteria however 0-5 white blood cells, negative leukocytes, negative nitrites.  Less likely UTI.  CT imaging of the head, maxillofacial, and cervical spine show  1. No acute intracranial pathology. 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 2. No displaced fractures or dislocations of the facial bones. 3. Soft tissue laceration and contusion to the left temporal and left cheek. 4. No fracture or subluxation of the cervical  spine.  X-ray imaging of her hips and pelvis show here is no evidence of hip fracture or dislocation. No acute displaced fracture or diastasis of the bones of the pelvis. Bilateral hip moderate degenerative changes.  Small abrasion noted superiorly to the left eyebrow.  Superficial, there is no real tissue to suture back together, this is more of a deeper abrasion.  Will place Dermabond on the area and cover with bandage.  Please see procedure note for additional information.  Initially, patient had husband and son at bedside and had difficulty with ambulation. May possibly need TOC consultation.   More family at bedside and the patient appear less anxious. She has eaten [employer]. I personally ambulated the patient with a walker and she is ambulatory with ease. Given her history of tremors, and her recent fall, I would feel more comfortable if the patient was discharged home to family where someone could watch her as she is living in independent living and her husband is older than she is. Family is completely receptive of taking her in and she will be staying her one of her sons.   I discussed the lab and imaging results with the family at bedside. We  discussed strict return precautions and red flag symptoms. They verbalize their understanding and agree to the plan. The patient is stable and being discharged home in good condition.   I discussed this case with my attending physician who cosigned this note including patient's presenting symptoms, physical exam, and planned diagnostics and interventions. Attending physician stated agreement with plan or made changes to plan which were implemented.   Attending physician assessed patient at bedside.   ADDENDUM: Abrasion was approximately 1cm x 1cm  Final Clinical Impression(s) / ED Diagnoses Final diagnoses:  Fall, initial encounter  Forehead abrasion, initial encounter  Thrombocytopenia [facility])    Rx / DC Orders ED Discharge Orders     None         Achille Rich, PA-C 01/20/22 1836    Glyn Ade, MD 01/21/22 1449    Achille Rich, PA-C 03/06/22 5009    Glyn Ade, MD 03/06/22 1422

## 2022-01-16 NOTE — ED Triage Notes (Signed)
BIBA Per EMS: Pt coming from [facility] w/ c/o mechanical fall today. Pt hit head & has lac to left eye. Denies thinners, denies LOC.  VSS

## 2022-01-16 NOTE — Discharge Instructions (Addendum)
You were seen in the ER today for evaluation after your fall. Other than the laceration, your imaging was unremarkable. Your labs show that you have lower platelets, but I am unsure if this is a new finding for you given that you have not had any recent labs that I can see. I would follow up with your primary care provider about this.  Additionally, I would record your blood pressures for the next day or 2 and provide them to your primary care doctor to follow-up with as you may need further blood pressure management.  No additional things need to be done to the glue on her forehead, it will fall off between 7 and 10 days.  You can keep the surrounding skin clean with soap and water.  You can apply ice to the face for 15 minutes every few hours as needed.  Can take Tylenol as needed for pain.  If you have any concerns, new or worsening symptoms, please return to the nearest emergency department for reevaluation.  Get help right away if: You have: A very bad headache that is not helped by medicine. Trouble walking or weakness in your arms and legs. Clear or bloody fluid coming from your nose or ears. Changes in how you see (vision). A seizure. More confusion or more grumpy moods. Your symptoms get worse. You are sleepier than normal and have trouble staying awake. You lose your balance. The black centers of your eyes (pupils) change in size. Your speech is slurred. Your dizziness gets worse. You vomit. These symptoms may be an emergency. Do not wait to see if the symptoms will go away. Get medical help right away. Call your local emergency services (911 in the [place] Do not drive yourself to the hospital.

## 2022-01-16 NOTE — ED Notes (Signed)
Patient has a urine culture in the main lab 

## 2022-02-01 ENCOUNTER — Other Ambulatory Visit: Payer: Self-pay | Admitting: Physician Assistant

## 2022-02-11 ENCOUNTER — Other Ambulatory Visit: Payer: Self-pay | Admitting: Physician Assistant

## 2022-03-01 ENCOUNTER — Encounter: Payer: Self-pay | Admitting: Physician Assistant

## 2022-03-01 ENCOUNTER — Ambulatory Visit (INDEPENDENT_AMBULATORY_CARE_PROVIDER_SITE_OTHER): Payer: Medicare Other | Admitting: Physician Assistant

## 2022-03-01 VITALS — BP 173/79 | HR 55 | Ht 61.0 in | Wt 112.4 lb

## 2022-03-01 DIAGNOSIS — F01518 Vascular dementia, unspecified severity, with other behavioral disturbance: Secondary | ICD-10-CM | POA: Insufficient documentation

## 2022-03-01 NOTE — Patient Instructions (Signed)
It was a pleasure to see you today at our office.   Recommendations:  Meds: Follow up in 6 months Continue Memantine10 mg tablets. Continue Donepezil 10 daily Continue the Zoloft and Depakote, monitor with family doctor    RECOMMENDATIONS FOR ALL PATIENTS WITH MEMORY PROBLEMS: 1. Continue to exercise (Recommend 30 minutes of walking everyday, or 3 hours every week) 2. Increase social interactions - continue going to [place] and enjoy social gatherings with friends and family 3. Eat healthy, avoid fried foods and eat more fruits and vegetables 4. Maintain adequate blood pressure, blood sugar, and blood cholesterol level. Reducing the risk of stroke and cardiovascular disease also helps promoting better memory. 5. Avoid stressful situations. Live a simple life and avoid aggravations. Organize your time and prepare for the next day in anticipation. 6. Sleep well, avoid any interruptions of sleep and avoid any distractions in the bedroom that may interfere with adequate sleep quality 7. Avoid sugar, avoid sweets as there is a strong link between excessive sugar intake, diabetes, and cognitive impairment We discussed the Mediterranean diet, which has been shown to help patients reduce the risk of progressive memory disorders and reduces cardiovascular risk. This includes eating fish, eat fruits and green leafy vegetables, nuts like almonds and hazelnuts, walnuts, and also use olive oil. Avoid fast foods and fried foods as much as possible. Avoid sweets and sugar as sugar use has been linked to worsening of memory function.  There is always a concern of gradual progression of memory problems. If this is the case, then we may need to adjust level of care according to patient needs. Support, both to the patient and caregiver, should then be put into place.    The Alzheimer's Association is here all day, every day for people facing Alzheimer's disease through our free 24/7 Helpline: [phone]. The  Helpline provides reliable information and support to all those who need assistance, such as individuals living with memory loss, Alzheimer's or other dementia, caregivers, health care professionals and the public.  Our highly trained and knowledgeable staff can help you with: Understanding memory loss, dementia and Alzheimer's  Medications and other treatment options  General information about aging and brain health  Skills to provide quality care and to find the best care from professionals  Legal, financial and living-arrangement decisions Our Helpline also features: Confidential care consultation provided by master's level clinicians who can help with decision-making support, crisis assistance and education on issues families face every day  Help in a caller's preferred language using our translation service that features more than 200 languages and dialects  Referrals to local community programs, services and ongoing support     FALL PRECAUTIONS: Be cautious when walking. Scan the area for obstacles that may increase the risk of trips and falls. When getting up in the mornings, sit up at the edge of the bed for a few minutes before getting out of bed. Consider elevating the bed at the head end to avoid drop of blood pressure when getting up. Walk always in a well-lit room (use night lights in the walls). Avoid area rugs or power cords from appliances in the middle of the walkways. Use a walker or a cane if necessary and consider physical therapy for balance exercise. Get your eyesight checked regularly.  FINANCIAL OVERSIGHT: Supervision, especially oversight when making financial decisions or transactions is also recommended.  HOME SAFETY: Consider the safety of the kitchen when operating appliances like stoves, microwave oven, and blender. Consider having supervision  and share cooking responsibilities until no longer able to participate in those. Accidents with firearms and other hazards in  the house should be identified and addressed as well.   ABILITY TO BE LEFT ALONE: If patient is unable to contact 911 operator, consider using LifeLine, or when the need is there, arrange for someone to stay with patients. Smoking is a fire hazard, consider supervision or cessation. Risk of wandering should be assessed by caregiver and if detected at any point, supervision and safe proof recommendations should be instituted.  MEDICATION SUPERVISION: Inability to self-administer medication needs to be constantly addressed. Implement a mechanism to ensure safe administration of the medications.   DRIVING: Regarding driving, in patients with progressive memory problems, driving will be impaired. We advise to have someone else do the driving if trouble finding directions or if minor accidents are reported. Independent driving assessment is available to determine safety of driving.   If you are interested in the driving assessment, you can contact the following:  [employer] in [place] [phone]  [facility] [phone]  [facility] [phone] or [phone]      Mediterranean Diet A Mediterranean diet refers to food and lifestyle choices that are based on the traditions of countries located on the [employer]. This way of eating has been shown to help prevent certain conditions and improve outcomes for people who have chronic diseases, like kidney disease and heart disease. What are tips for following this plan? Lifestyle  Cook and eat meals together with your family, when possible. Drink enough fluid to keep your urine clear or pale yellow. Be physically active every day. This includes: Aerobic exercise like running or swimming. Leisure activities like gardening, walking, or housework. Get 7-8 hours of sleep each night. If recommended by your health care provider, drink red wine in moderation. This  means 1 glass a day for nonpregnant women and 2 glasses a day for men. A glass of wine equals 5 oz (150 mL). Reading food labels  Check the serving size of packaged foods. For foods such as rice and pasta, the serving size refers to the amount of cooked product, not dry. Check the total fat in packaged foods. Avoid foods that have saturated fat or trans fats. Check the ingredients list for added sugars, such as corn syrup. Shopping  At the grocery store, buy most of your food from the areas near the walls of the store. This includes: Fresh fruits and vegetables (produce). Grains, beans, nuts, and seeds. Some of these may be available in unpackaged forms or large amounts (in bulk). Fresh seafood. Poultry and eggs. Low-fat dairy products. Buy whole ingredients instead of prepackaged foods. Buy fresh fruits and vegetables in-season from local farmers markets. Buy frozen fruits and vegetables in resealable bags. If you do not have access to quality fresh seafood, buy precooked frozen shrimp or canned fish, such as tuna, salmon, or sardines. Buy small amounts of raw or cooked vegetables, salads, or olives from the deli or salad bar at your store. Stock your pantry so you always have certain foods on hand, such as olive oil, canned tuna, canned tomatoes, rice, pasta, and beans. Cooking  Cook foods with extra-virgin olive oil instead of using butter or other vegetable oils. Have meat as a side dish, and have vegetables or grains as your main dish. This means having meat in small portions or adding small amounts of meat to foods like pasta or stew. Use  beans or vegetables instead of meat in common dishes like chili or lasagna. Experiment with different cooking methods. Try roasting or broiling vegetables instead of steaming or sauteing them. Add frozen vegetables to soups, stews, pasta, or rice. Add nuts or seeds for added healthy fat at each meal. You can add these to yogurt, salads, or vegetable  dishes. Marinate fish or vegetables using olive oil, lemon juice, garlic, and fresh herbs. Meal planning  Plan to eat 1 vegetarian meal one day each week. Try to work up to 2 vegetarian meals, if possible. Eat seafood 2 or more times a week. Have healthy snacks readily available, such as: Vegetable sticks with hummus. Greek yogurt. Fruit and nut trail mix. Eat balanced meals throughout the week. This includes: Fruit: 2-3 servings a day Vegetables: 4-5 servings a day Low-fat dairy: 2 servings a day Fish, poultry, or lean meat: 1 serving a day Beans and legumes: 2 or more servings a week Nuts and seeds: 1-2 servings a day Whole grains: 6-8 servings a day Extra-virgin olive oil: 3-4 servings a day Limit red meat and sweets to only a few servings a month What are my food choices? Mediterranean diet Recommended Grains: Whole-grain pasta. Brown rice. Bulgar wheat. Polenta. Couscous. Whole-wheat bread. [place]. Vegetables: Artichokes. Beets. Broccoli. Cabbage. Carrots. Eggplant. Green beans. Chard. Kale. Spinach. Onions. Leeks. Peas. Squash. Tomatoes. Peppers. Radishes. Fruits: Apples. Apricots. Avocado. Berries. Bananas. Cherries. Dates. Figs. Grapes. Lemons. Melon. Oranges. Peaches. Plums. Pomegranate. Meats and other protein foods: Beans. Almonds. Sunflower seeds. Pine nuts. Peanuts. [place]. Salmon. Scallops. Shrimp. [place]. Tilapia. Clams. Oysters. Eggs. Dairy: Low-fat milk. Cheese. Greek yogurt. Beverages: Water. Red wine. Herbal tea. Fats and oils: Extra virgin olive oil. Avocado oil. Grape seed oil. Sweets and desserts: [place] yogurt with honey. Baked apples. Poached pears. Trail mix. Seasoning and other foods: Basil. Cilantro. Coriander. Cumin. Mint. Parsley. Sage. Rosemary. Tarragon. Garlic. Oregano. Thyme. Pepper. Balsalmic vinegar. Tahini. Hummus. Tomato sauce. Olives. Mushrooms. Limit these Grains: Prepackaged pasta or rice dishes. Prepackaged cereal with added  sugar. Vegetables: Deep fried potatoes (french fries). Fruits: Fruit canned in syrup. Meats and other protein foods: Beef. Pork. Lamb. Poultry with skin. Hot dogs. Berniece Salines. Dairy: Ice cream. Sour cream. Whole milk. Beverages: Juice. Sugar-sweetened soft drinks. Beer. Liquor and spirits. Fats and oils: Butter. Canola oil. Vegetable oil. Beef fat (tallow). Lard. Sweets and desserts: Cookies. Cakes. Pies. Candy. Seasoning and other foods: Mayonnaise. Premade sauces and marinades. The items listed may not be a complete list. Talk with your dietitian about what dietary choices are right for you. Summary The Mediterranean diet includes both food and lifestyle choices. Eat a variety of fresh fruits and vegetables, beans, nuts, seeds, and whole grains. Limit the amount of red meat and sweets that you eat. Talk with your health care provider about whether it is safe for you to drink red wine in moderation. This means 1 glass a day for nonpregnant women and 2 glasses a day for men. A glass of wine equals 5 oz (150 mL). This information is not intended to replace advice given to you by your health care provider. Make sure you discuss any questions you have with your health care provider. Document Released: 11/27/2015 Document Revised: 12/30/2015 Document Reviewed: 11/27/2015 Elsevier Interactive Patient Education  2017 [employer].

## 2022-03-01 NOTE — Progress Notes (Signed)
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 RH female seen today in follow up for memory loss. Patient is currently on memantine 10 mg twice daily and on donepezil 10 mg daily.  In today's visit, mild decline when compared to prior evaluation.  She is unable to name objects in today's visit or to have her MMSE performed.  We will continue to observe, and after reassessment in the following visit, we may consider discontinuing the medication if not therapeutic.  For mood, the patient is on Depakote and Zoloft as her PCP.  She has some tremors related to Depakote, but these are not worse from prior.     Follow up in 6 months. Continue 24/7 monitoring  Continue to control mood as per PCP, patient is on Zoloft and Depakote Continue Memantine 10 mg twice daily. Side effects were discussed  Recommend good control of cardiovascular risk factors.   Continue physical therapy for strength and balance    Subjective:    This patient is accompanied in the office by her daughter and husband who supplements the history.  Previous records as well as any outside records available were reviewed prior to todays visit. Last seen on 08/24/21. Last MMSE on 05/2021 was 15/30    Any changes in memory since last visit?  Patient continues to forget family members' names especially her grandchildren.  She may forget recent conversations.  She sings in her choir, and she is enjoying their activity.  She tries to stay as active as tolerated.   repeats oneself?  Endorsed Disoriented when walking into a room? Occasionally she does not recognize her own place or prior familiar locations.  Leaving objects in unusual places? Endorsed. "Everywhere". Ambulates  with difficulty?   Patient denies, exercises daily.   Does not use the walker for now, physical therapy is to introduce the walker soon..   Recent falls?  She had a recent fall in August 2023, she hit the curb, hitting the left side of her head as she was taking smaller steps, but the imaging was negative for acute findings.  There was no loss of consciousness.  Since then, she is doing physical therapy and her steps have improved. Any head injuries?  Patient denies   History of seizures?   Patient denies   Wandering behavior?    Endorsed, [place] . Does not walk outside of the [place]  Patient drives?   Patient no longer drives  Any mood changes since last visit?  "Much better, not as agitated as before " Any worsening depression?:  Patient denies   Hallucinations?  Patient denies   Paranoia?   "not as bad as 1 y ago " Patient reports that sleeps well without vivid dreams, REM behavior or sleepwalking   History of sleep apnea?  Patient denies   Any hygiene concerns?  Patient denies   Independent of bathing and dressing?  Endorsed  Does the patient need help with medications? [facility] comes 2 times a day to give her medications, and take care of her giving her lunch and laundry taking care of.   Who is in charge of the finances?  Daughter  is in charge   Any changes in appetite?  "She will eat if it in from of her ", otherwise she may forget to eat Patient have trouble swallowing? Patient  denies   Does the patient cook? Denies  Any headaches?  Patient denies   Double vision? Patient denies   Any focal numbness or tingling?  Patient denies   Chronic back pain Patient denies   Unilateral weakness?  Patient denies   Any tremors?  while on Depakote   Any history of anosmia?  Her sense of smell is not as god as before Any incontinence of urine?  "She forgets she went and goes again " Any bowel dysfunction?  Occasional diarrhea     Patient lives with: at [facility] with her husband     Initial Visit 05/26/21 The patient is seen in neurologic consultation at the request of Cleatis Polka., MD  for the evaluation of short-term memory loss.  The patient is accompanied by her daughter who provides most of the history.  This is a 81 y.o. year old RH  female resident of [facility] who has had memory issues for about 5 years.  Patient is in denial of her cognitive status. Daughter reports that the family noticed that over the years she cannot remember her family members names, especially the great grandchildren's, but able to remember the children's name.  She repeats herself over the last year, and is more disoriented when walking into her room.  She is also very anxious.  The patient is under the care of a geriatric psychiatrist, as at times she becomes combative.  She is on Zoloft 100 mg and Depakote  daily.  She needs assistance when using the phone, or doing light cooking.  She ambulates without difficulty, denies any falls or head injuries.  She has a tendency to wander off, which has happened 2 times during this year.  She no longer drives.  She lives with her husband at the facility, and most of the time she is with him.  Her mood is very anxious, no apparent depression.  It is unknown if she sleeps well, or has vivid dreams or sleepwalking.  Patient denies hallucinations or paranoia.  However, daughter reports that patient is constantly worried that her daughter is talking about her.  There are no hygiene concerns, she is independent of bathing and dressing.  Medications are administered by [facility], she was missing several doses of them.  Her daughter is in charge of the finances.  Her appetite is good, denies trouble swallowing.  She denies any headaches, double vision, dizziness, focal numbness or tingling, unilateral weakness, tremors or anosmia.  No history of seizures.  Denies urine incontinence, retention, constipation or diarrhea, OSA, alcohol or tobacco.  Family history negative for Alzheimer's disease.  She is retired from office work at age 65.   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   donepezil (ARICEPT) 10 MG tablet Take 1 tablet (10 mg total) by mouth daily.   memantine (NAMENDA) 10 MG tablet TAKE ONE TABLET TWICE DAILY   rosuvastatin (CRESTOR) 10 MG tablet Take 10 mg by mouth daily.   sertraline (ZOLOFT) 100 MG tablet Take 100 mg by mouth daily.   No facility-administered encounter  medications on file as of 03/01/2022.       05/25/2021   10:00 AM  MMSE - Mini Mental State Exam  Orientation to time 1  Orientation to Place 2  Registration 3  Attention/ Calculation 2  Recall 0  Language- name 2 objects 2  Language- repeat 0  Language- follow 3 step command 3  Language- read & follow direction 1  Write a sentence 1  Copy design 0  Total score 15       No data to display          Objective:     PHYSICAL EXAMINATION:    VITALS:   Vitals:   03/01/22 1046  BP: (!) 173/79  Pulse: (!) 55  SpO2: 99%  Weight: 112 lb 6.4 oz (51 kg)  Height: 5\' 1"  (1.549 m)    GEN:  The patient appears stated age and is in NAD. HEENT:  Normocephalic, atraumatic.   Neurological examination:  General: NAD, well-groomed, appears stated age. Orientation: The patient is alert. Oriented to self, not to place or date.  She could not recognize her husband today. Cranial nerves: There is good facial symmetry.The speech is fluent and clear. No aphasia or dysarthria. Fund of knowledge is.  Recent and remote memory are impaired. Attention and concentration are reduced.  Unable to name objects including pen, bottle, however, and unable to repeat phrases.  Hearing  is intact to conversational tone.    Sensation: Sensation is intact to light touch throughout Motor: Strength is at least antigravity x4. DTR's 2/4 in UE/LE     Movement examination: Tone: There is normal tone in the UE/LE Abnormal movements:  Bilateral resting and intention tremor, no cogwheeling.  No myoclonus.  No asterixis.   Coordination:  There is no decremation with RAM's. Normal finger to nose  Gait and Station: The patient has no difficulty arising out of a deep-seated chair without the use of the hands. The patient's stride length is short  Gait is cautious and narrow.    Thank you for allowing the opportunity to participate in the care of this nice patient. Please do not hesitate to contact us for any questions or concerns.   Total time spent on today's visit was 31 minutes dedicated to this patient today, preparing to see patient, examining the patient, ordering tests and/or medications and counseling the patient, documenting clinical information in the EHR or other health record, independently interpreting results and communicating results to the patient/family, discussing treatment and goals, answering patient's questions and coordinating care.  Cc:  [place]., MD  Cleatis Polka 03/01/2022 11:23 AM

## 2022-03-09 ENCOUNTER — Other Ambulatory Visit: Payer: Self-pay | Admitting: Physician Assistant

## 2022-03-18 ENCOUNTER — Other Ambulatory Visit: Payer: Self-pay

## 2022-03-18 ENCOUNTER — Telehealth: Payer: Self-pay | Admitting: Physician Assistant

## 2022-03-18 NOTE — Telephone Encounter (Signed)
1. Which medications need refilled? (List name and dosage, if known) donepezil and memantine  2. Which pharmacy/location is medication to be sent to? (include street and city if local pharmacy) [facility]  90 day supply

## 2022-07-01 ENCOUNTER — Inpatient Hospital Stay (HOSPITAL_COMMUNITY)
Admission: EM | Admit: 2022-07-01 | Discharge: 2022-07-07 | DRG: 178 | Disposition: A | Discharge status: Skilled Nursing Facility | Payer: Medicare Other | Source: Skilled Nursing Facility | Attending: Internal Medicine | Admitting: Internal Medicine

## 2022-07-01 ENCOUNTER — Observation Stay (HOSPITAL_COMMUNITY): Payer: Medicare Other

## 2022-07-01 ENCOUNTER — Encounter (HOSPITAL_COMMUNITY): Payer: Self-pay | Admitting: Internal Medicine

## 2022-07-01 ENCOUNTER — Emergency Department (HOSPITAL_COMMUNITY): Payer: Medicare Other

## 2022-07-01 ENCOUNTER — Other Ambulatory Visit: Payer: Self-pay

## 2022-07-01 DIAGNOSIS — G309 Alzheimer's disease, unspecified: Secondary | ICD-10-CM | POA: Diagnosis present

## 2022-07-01 DIAGNOSIS — E860 Dehydration: Secondary | ICD-10-CM | POA: Diagnosis present

## 2022-07-01 DIAGNOSIS — R32 Unspecified urinary incontinence: Secondary | ICD-10-CM | POA: Diagnosis present

## 2022-07-01 DIAGNOSIS — I10 Essential (primary) hypertension: Secondary | ICD-10-CM | POA: Diagnosis present

## 2022-07-01 DIAGNOSIS — E785 Hyperlipidemia, unspecified: Secondary | ICD-10-CM | POA: Diagnosis not present

## 2022-07-01 DIAGNOSIS — Z888 Allergy status to other drugs, medicaments and biological substances status: Secondary | ICD-10-CM | POA: Diagnosis not present

## 2022-07-01 DIAGNOSIS — Z79899 Other long term (current) drug therapy: Secondary | ICD-10-CM

## 2022-07-01 DIAGNOSIS — F01518 Vascular dementia, unspecified severity, with other behavioral disturbance: Secondary | ICD-10-CM | POA: Diagnosis not present

## 2022-07-01 DIAGNOSIS — Z66 Do not resuscitate: Secondary | ICD-10-CM | POA: Diagnosis present

## 2022-07-01 DIAGNOSIS — Z881 Allergy status to other antibiotic agents status: Secondary | ICD-10-CM

## 2022-07-01 DIAGNOSIS — F01511 Vascular dementia, unspecified severity, with agitation: Secondary | ICD-10-CM | POA: Diagnosis present

## 2022-07-01 DIAGNOSIS — Z8673 Personal history of transient ischemic attack (TIA), and cerebral infarction without residual deficits: Secondary | ICD-10-CM | POA: Diagnosis not present

## 2022-07-01 DIAGNOSIS — R2689 Other abnormalities of gait and mobility: Secondary | ICD-10-CM | POA: Diagnosis present

## 2022-07-01 DIAGNOSIS — F0150 Vascular dementia without behavioral disturbance: Secondary | ICD-10-CM | POA: Diagnosis present

## 2022-07-01 DIAGNOSIS — F02811 Dementia in other diseases classified elsewhere, unspecified severity, with agitation: Secondary | ICD-10-CM | POA: Diagnosis present

## 2022-07-01 DIAGNOSIS — Z781 Physical restraint status: Secondary | ICD-10-CM | POA: Diagnosis not present

## 2022-07-01 DIAGNOSIS — G459 Transient cerebral ischemic attack, unspecified: Secondary | ICD-10-CM

## 2022-07-01 DIAGNOSIS — U071 COVID-19: Principal | ICD-10-CM

## 2022-07-01 DIAGNOSIS — G319 Degenerative disease of nervous system, unspecified: Secondary | ICD-10-CM | POA: Diagnosis present

## 2022-07-01 DIAGNOSIS — R531 Weakness: Secondary | ICD-10-CM

## 2022-07-01 DIAGNOSIS — F0280 Dementia in other diseases classified elsewhere without behavioral disturbance: Secondary | ICD-10-CM | POA: Diagnosis present

## 2022-07-01 LAB — CBC WITH DIFFERENTIAL/PLATELET
Abs Immature Granulocytes: 0.02 10*3/uL (ref 0.00–0.07)
Basophils Absolute: 0 10*3/uL (ref 0.0–0.1)
Basophils Relative: 0 %
Eosinophils Absolute: 0.1 10*3/uL (ref 0.0–0.5)
Eosinophils Relative: 1 %
HCT: 36.1 % (ref 36.0–46.0)
Hemoglobin: 11.7 g/dL — ABNORMAL LOW (ref 12.0–15.0)
Immature Granulocytes: 0 %
Lymphocytes Relative: 28 %
Lymphs Abs: 2.1 10*3/uL (ref 0.7–4.0)
MCH: 30.5 pg (ref 26.0–34.0)
MCHC: 32.4 g/dL (ref 30.0–36.0)
MCV: 94 fL (ref 80.0–100.0)
Monocytes Absolute: 0.5 10*3/uL (ref 0.1–1.0)
Monocytes Relative: 6 %
Neutro Abs: 4.8 10*3/uL (ref 1.7–7.7)
Neutrophils Relative %: 65 %
Platelets: 117 10*3/uL — ABNORMAL LOW (ref 150–400)
RBC: 3.84 MIL/uL — ABNORMAL LOW (ref 3.87–5.11)
RDW: 12.6 % (ref 11.5–15.5)
WBC: 7.4 10*3/uL (ref 4.0–10.5)
nRBC: 0 % (ref 0.0–0.2)

## 2022-07-01 LAB — C-REACTIVE PROTEIN: CRP: <0.5 mg/dL (ref <1.0)

## 2022-07-01 LAB — URINALYSIS, W/ REFLEX TO CULTURE (INFECTION SUSPECTED)
Bacteria, UA: NONE SEEN
Bilirubin Urine: NEGATIVE
Glucose, UA: NEGATIVE mg/dL
Ketones, ur: 5 mg/dL — AB
Leukocytes,Ua: NEGATIVE
Nitrite: NEGATIVE
Protein, ur: NEGATIVE mg/dL
Specific Gravity, Urine: 1.019 (ref 1.005–1.030)
pH: 7 (ref 5.0–8.0)

## 2022-07-01 LAB — TROPONIN I (HIGH SENSITIVITY): Troponin I (High Sensitivity): 6 ng/L (ref <18)

## 2022-07-01 LAB — RESP PANEL BY RT-PCR (RSV, FLU A&B, COVID)  RVPGX2
Influenza A by PCR: NEGATIVE
Influenza B by PCR: NEGATIVE
Resp Syncytial Virus by PCR: NEGATIVE
SARS Coronavirus 2 by RT PCR: POSITIVE — AB

## 2022-07-01 LAB — LIPID PANEL
Cholesterol: 130 mg/dL (ref 0–200)
HDL: 61 mg/dL (ref >40)
LDL Cholesterol: 55 mg/dL (ref 0–99)
Total CHOL/HDL Ratio: 2.1 RATIO
Triglycerides: 71 mg/dL (ref <150)
VLDL: 14 mg/dL (ref 0–40)

## 2022-07-01 LAB — BASIC METABOLIC PANEL
Anion gap: 9 (ref 5–15)
BUN: 20 mg/dL (ref 8–23)
CO2: 25 mmol/L (ref 22–32)
Calcium: 8.8 mg/dL — ABNORMAL LOW (ref 8.9–10.3)
Chloride: 103 mmol/L (ref 98–111)
Creatinine, Ser: 0.74 mg/dL (ref 0.44–1.00)
GFR, Estimated: >60 mL/min (ref >60)
Glucose, Bld: 99 mg/dL (ref 70–99)
Potassium: 4 mmol/L (ref 3.5–5.1)
Sodium: 137 mmol/L (ref 135–145)

## 2022-07-01 MED ORDER — MEMANTINE HCL 10 MG PO TABS
10.0000 mg | ORAL_TABLET | Freq: Two times a day (BID) | ORAL | Status: DC
  Administered 2022-07-01 – 2022-07-07 (×14): 10 mg via ORAL
  Filled 2022-07-01: qty 2
  Filled 2022-07-01 (×6): qty 1
  Filled 2022-07-01: qty 2
  Filled 2022-07-01 (×6): qty 1

## 2022-07-01 MED ORDER — DIAZEPAM 5 MG/ML IJ SOLN: 5.0000 mg | Freq: Once | INTRAMUSCULAR | Status: DC | PRN

## 2022-07-01 MED ORDER — ASPIRIN 81 MG PO TBEC
81.0000 mg | DELAYED_RELEASE_TABLET | Freq: Every day | ORAL | Status: DC
  Administered 2022-07-02 – 2022-07-07 (×6): 81 mg via ORAL
  Filled 2022-07-01 (×6): qty 1

## 2022-07-01 MED ORDER — ACETAMINOPHEN 325 MG PO TABS
650.0000 mg | ORAL_TABLET | ORAL | Status: DC | PRN
  Administered 2022-07-03: 650 mg via ORAL
  Filled 2022-07-01: qty 2

## 2022-07-01 MED ORDER — ROSUVASTATIN CALCIUM 20 MG PO TABS
10.0000 mg | ORAL_TABLET | Freq: Every day | ORAL | Status: DC
  Administered 2022-07-01: 10 mg via ORAL
  Filled 2022-07-01: qty 1

## 2022-07-01 MED ORDER — SODIUM CHLORIDE 0.9 % IV SOLN: INTRAVENOUS | Status: DC

## 2022-07-01 MED ORDER — DIVALPROEX SODIUM 250 MG PO DR TAB
250.0000 mg | DELAYED_RELEASE_TABLET | Freq: Two times a day (BID) | ORAL | Status: DC
  Administered 2022-07-01 – 2022-07-07 (×14): 250 mg via ORAL
  Filled 2022-07-01 (×14): qty 1

## 2022-07-01 MED ORDER — HEPARIN SODIUM (PORCINE) 5000 UNIT/ML IJ SOLN
5000.0000 [IU] | Freq: Three times a day (TID) | INTRAMUSCULAR | Status: DC
  Administered 2022-07-01 – 2022-07-03 (×7): 5000 [IU] via SUBCUTANEOUS
  Filled 2022-07-01 (×7): qty 1

## 2022-07-01 MED ORDER — SERTRALINE HCL 100 MG PO TABS
100.0000 mg | ORAL_TABLET | Freq: Every day | ORAL | Status: DC
  Administered 2022-07-01 – 2022-07-07 (×7): 100 mg via ORAL
  Filled 2022-07-01 (×3): qty 1
  Filled 2022-07-01: qty 2
  Filled 2022-07-01 (×3): qty 1

## 2022-07-01 MED ORDER — ASPIRIN 325 MG PO TABS
325.0000 mg | ORAL_TABLET | Freq: Once | ORAL | Status: AC
  Administered 2022-07-01: 325 mg via ORAL
  Filled 2022-07-01 (×2): qty 1

## 2022-07-01 MED ORDER — AMLODIPINE BESYLATE 5 MG PO TABS: 5.0000 mg | ORAL_TABLET | Freq: Every day | ORAL | Status: DC

## 2022-07-01 MED ORDER — NIRMATRELVIR/RITONAVIR (PAXLOVID)TABLET
3.0000 | ORAL_TABLET | Freq: Two times a day (BID) | ORAL | Status: AC
  Administered 2022-07-01 – 2022-07-05 (×10): 3 via ORAL
  Filled 2022-07-01: qty 30

## 2022-07-01 MED ORDER — ACETAMINOPHEN 650 MG RE SUPP: 650.0000 mg | RECTAL | Status: DC | PRN

## 2022-07-01 MED ORDER — DONEPEZIL HCL 10 MG PO TABS
10.0000 mg | ORAL_TABLET | Freq: Every day | ORAL | Status: DC
  Administered 2022-07-01 – 2022-07-07 (×7): 10 mg via ORAL
  Filled 2022-07-01 (×2): qty 1
  Filled 2022-07-01: qty 2
  Filled 2022-07-01 (×4): qty 1

## 2022-07-01 MED ORDER — ONDANSETRON HCL 4 MG/2ML IJ SOLN: 4.0000 mg | Freq: Four times a day (QID) | INTRAMUSCULAR | Status: DC | PRN

## 2022-07-01 MED ORDER — STROKE: EARLY STAGES OF RECOVERY BOOK
Freq: Once | Status: AC
  Filled 2022-07-01: qty 1

## 2022-07-01 MED ORDER — ACETAMINOPHEN 160 MG/5ML PO SOLN: 650.0000 mg | ORAL | Status: DC | PRN

## 2022-07-01 MED ORDER — AMLODIPINE BESYLATE 5 MG PO TABS
2.5000 mg | ORAL_TABLET | Freq: Every day | ORAL | Status: DC
  Administered 2022-07-02: 2.5 mg via ORAL
  Filled 2022-07-01: qty 1

## 2022-07-01 NOTE — ED Triage Notes (Signed)
[facility]. Pt having incontinent episodes over past week. Pt family reports pt having increased weakness and abnormal gait over the past week. pt reported being unable to walk starting today. Hx of dementia and TIA Pt temp 100.2  Cbg 117 190/80BP 60HR 98% o2 RA

## 2022-07-01 NOTE — Progress Notes (Signed)
Current Vitals:  RA Sp02 95% HR 66 RR 14 BS clear CXR 07/01/22- No active disease process  RT attempted to instruct ordered Flutter device. PT unable to follow complete instruction (has dry cough at this time). PT will take deep breaths and cough when encouraged. RN aware.

## 2022-07-01 NOTE — Assessment & Plan Note (Addendum)
-  Patient admitted with concerns of TIA versus CVA due to significant generalized lower extremity weakness.   -Head CT done with no acute intracranial abnormality.  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  -MRI brain done negative for acute abnormalities, brain atrophy and remote left frontal infarct.   -Carotid Dopplers with no significant ICA stenosis. -2D echo with EF of 55 to 60%, NWMA, no significant valvular abnormalities.  Atrial septum was grossly normal.   -Fasting lipid panel with cholesterol of 130, HDL of 61, LDL of 55.   -Initially due to concern for acute CVA permissive hypertension was allowed however MRI negative and as such patient started on low-dose Norvasc. -Case discussed with neurology who does not feel anything from their standpoint at this time.   -Continue IV fluids, PT/OT/ST.   -Supportive care.

## 2022-07-01 NOTE — ED Provider Notes (Signed)
[facility] DEPT Provider Note: Georgena Spurling, MD, FACEP  CSN: [id number] MRN: [id number] ARRIVAL: 07/01/22 at [place]: [place]  Weakness  Level 5 caveat: Dementia HISTORY OF PRESENT ILLNESS  07/01/22 12:32 AM Sue Olsen is a 82 y.o. female with a history of dementia.  Her daughter is the principal historian.  She was sent from her living facility due to increased generalized weakness and abnormal gait (shuffling) over the past week.  Her mental status and ability to converse has not changed per her daughter.  Yesterday she was unable to walk at all and had an episode of urinary incontinence while in bed which she has never done before.  She reportedly had a temperature as high as 100.2.   Past Medical History:  Diagnosis Date   Dementia ([place])    Hyperlipidemia     History reviewed. No pertinent surgical history.  History reviewed. No pertinent family history.  Social History   Tobacco Use   Smoking status: Never   Smokeless tobacco: Never  Vaping Use   Vaping Use: Never used  Substance Use Topics   Alcohol use: No   Drug use: No    Prior to Admission medications   Medication Sig Start Date End Date Taking? Authorizing Provider  divalproex (DEPAKOTE) 250 MG DR tablet Take 250 mg by mouth 2 (two) times daily. 11/19/21   [provider]  donepezil (ARICEPT) 10 MG tablet Take 1 tablet (10 mg total) by mouth daily. 08/24/21   Rondel Jumbo, PA-C  memantine (NAMENDA) 10 MG tablet TAKE ONE TABLET TWICE DAILY 02/01/22   Rondel Jumbo, PA-C  rosuvastatin (CRESTOR) 10 MG tablet Take 10 mg by mouth daily. 04/20/21   [provider]  sertraline (ZOLOFT) 100 MG tablet Take 100 mg by mouth daily. 11/19/21   [provider]    Allergies Cephalexin, Cephalosporins, and Azithromycin   REVIEW OF SYSTEMS  Level 5 caveat   PHYSICAL EXAMINATION  Initial Vital Signs Blood pressure (!) 162/104, pulse 63, temperature 98.3 F  (36.8 C), temperature source Oral, resp. rate 17, SpO2 99 %.  Examination General: Well-developed, well-nourished female in no acute distress; appearance consistent with age of record HENT: normocephalic; atraumatic Eyes: pupils equal, round and reactive to light Neck: supple Heart: regular rate and rhythm Lungs: clear to auscultation bilaterally Abdomen: soft; nondistended; nontender; bowel sounds present Extremities: No deformity; full range of motion; pulses normal Neurologic: Sleeping but arousable; noted to move all extremities Skin: Warm and dry Psychiatric: Flat affect   RESULTS  Summary of this visit's results, reviewed and interpreted by myself:   EKG Interpretation  Date/Time:  Thursday July 01 2022 00:47:38 EDT Ventricular Rate:  63 PR Interval:  138 QRS Duration: 96 QT Interval:  439 QTC Calculation: 450 R Axis:   3 Text Interpretation: Sinus rhythm Abnormal R-wave progression, early transition LVH with secondary repolarization abnormality No significant change was found Confirmed by Shanon Rosser [phone]) on 07/01/2022 12:50:20 AM       Laboratory Studies: Results for orders placed or performed during the hospital encounter of 07/01/22 (from the past 24 hour(s))  CBC with Differential     Status: Abnormal   Collection Time: 07/01/22 12:57 AM  Result Value Ref Range   WBC 7.4 4.0 - 10.5 K/uL   RBC 3.84 (L) 3.87 - 5.11 MIL/uL   Hemoglobin 11.7 (L) 12.0 - 15.0 g/dL   HCT 36.1 36.0 - 46.0 %   MCV 94.0 80.0 -  100.0 fL   MCH 30.5 26.0 - 34.0 pg   MCHC 32.4 30.0 - 36.0 g/dL   RDW 12.6 11.5 - 15.5 %   Platelets 117 (L) 150 - 400 K/uL   nRBC 0.0 0.0 - 0.2 %   Neutrophils Relative % 65 %   Neutro Abs 4.8 1.7 - 7.7 K/uL   Lymphocytes Relative 28 %   Lymphs Abs 2.1 0.7 - 4.0 K/uL   Monocytes Relative 6 %   Monocytes Absolute 0.5 0.1 - 1.0 K/uL   Eosinophils Relative 1 %   Eosinophils Absolute 0.1 0.0 - 0.5 K/uL   Basophils Relative 0 %   Basophils Absolute 0.0  0.0 - 0.1 K/uL   Immature Granulocytes 0 %   Abs Immature Granulocytes 0.02 0.00 - 0.07 K/uL  Basic metabolic panel     Status: Abnormal   Collection Time: 07/01/22 12:57 AM  Result Value Ref Range   Sodium 137 135 - 145 mmol/L   Potassium 4.0 3.5 - 5.1 mmol/L   Chloride 103 98 - 111 mmol/L   CO2 25 22 - 32 mmol/L   Glucose, Bld 99 70 - 99 mg/dL   BUN 20 8 - 23 mg/dL   Creatinine, Ser 0.74 0.44 - 1.00 mg/dL   Calcium 8.8 (L) 8.9 - 10.3 mg/dL   GFR, Estimated >60 >60 mL/min   Anion gap 9 5 - 15  Resp panel by RT-PCR (RSV, Flu A&B, Covid) Anterior Nasal Swab     Status: Abnormal   Collection Time: 07/01/22 12:57 AM   Specimen: Anterior Nasal Swab  Result Value Ref Range   SARS Coronavirus 2 by RT PCR POSITIVE (A) NEGATIVE   Influenza A by PCR NEGATIVE NEGATIVE   Influenza B by PCR NEGATIVE NEGATIVE   Resp Syncytial Virus by PCR NEGATIVE NEGATIVE  Troponin I (High Sensitivity)     Status: None   Collection Time: 07/01/22 12:57 AM  Result Value Ref Range   Troponin I (High Sensitivity) 6 <18 ng/L  Lipid panel     Status: None   Collection Time: 07/01/22 12:57 AM  Result Value Ref Range   Cholesterol 130 0 - 200 mg/dL   Triglycerides 71 <150 mg/dL   HDL 61 >40 mg/dL   Total CHOL/HDL Ratio 2.1 RATIO   VLDL 14 0 - 40 mg/dL   LDL Cholesterol 55 0 - 99 mg/dL  Urinalysis, w/ Reflex to Culture (Infection Suspected) -Urine, Clean Catch     Status: Abnormal   Collection Time: 07/01/22  1:58 AM  Result Value Ref Range   Specimen Source URINE, CLEAN CATCH    Color, Urine YELLOW YELLOW   APPearance CLEAR CLEAR   Specific Gravity, Urine 1.019 1.005 - 1.030   pH 7.0 5.0 - 8.0   Glucose, UA NEGATIVE NEGATIVE mg/dL   Hgb urine dipstick SMALL (A) NEGATIVE   Bilirubin Urine NEGATIVE NEGATIVE   Ketones, ur 5 (A) NEGATIVE mg/dL   Protein, ur NEGATIVE NEGATIVE mg/dL   Nitrite NEGATIVE NEGATIVE   Leukocytes,Ua NEGATIVE NEGATIVE   RBC / HPF 11-20 0 - 5 RBC/hpf   WBC, UA 0-5 0 - 5 WBC/hpf    Bacteria, UA NONE SEEN NONE SEEN   Squamous Epithelial / HPF 0-5 0 - 5 /HPF   Mucus PRESENT    Imaging Studies: MR BRAIN WO CONTRAST  Result Date: 07/01/2022 CLINICAL DATA:  TIA.  Weakness with walking. EXAM: MRI HEAD WITHOUT CONTRAST TECHNIQUE: Multiplanar, multiecho pulse sequences of the brain and surrounding structures  were obtained without intravenous contrast. COMPARISON:  Head CT from earlier today FINDINGS: Brain: No acute infarction, hemorrhage, hydrocephalus, extra-axial collection or mass lesion. Chronic left anterior frontal infarct affecting cortex. Mild chronic small vessel ischemia in the cerebral white matter. Cerebral volume loss especially affecting the anterior temporal lobes. Vascular: Normal flow voids. Skull and upper cervical spine: Normal marrow signal. Sinuses/Orbits: Negative. IMPRESSION: 1. No acute or reversible finding. 2. Brain atrophy and remote left frontal infarct. Electronically Signed   By: Jorje Guild M.D.   On: 07/01/2022 06:39   CT Head Wo Contrast  Result Date: 07/01/2022 CLINICAL DATA:  Delirium EXAM: CT HEAD WITHOUT CONTRAST TECHNIQUE: Contiguous axial images were obtained from the base of the skull through the vertex without intravenous contrast. RADIATION DOSE REDUCTION: This exam was performed according to the departmental dose-optimization program which includes automated exposure control, adjustment of the mA and/or kV according to patient size and/or use of iterative reconstruction technique. COMPARISON:  01/16/2022 FINDINGS: Brain: There is no mass, hemorrhage or extra-axial collection. There is generalized atrophy without lobar predilection. Hypodensity of the white matter is most commonly associated with chronic microvascular disease. Old inferior left frontal infarct. Vascular: No abnormal hyperdensity of the major intracranial arteries or dural venous sinuses. No intracranial atherosclerosis. Skull: The visualized skull base, calvarium and  extracranial soft tissues are normal. Sinuses/Orbits: No fluid levels or advanced mucosal thickening of the visualized paranasal sinuses. No mastoid or middle ear effusion. The orbits are normal. IMPRESSION: 1. No acute intracranial abnormality. 2. Old inferior left frontal infarct and findings of chronic microvascular disease. Electronically Signed   By: Ulyses Jarred M.D.   On: 07/01/2022 01:45   DG Chest Port 1 View  Result Date: 07/01/2022 CLINICAL DATA:  Incontinence with weakness and abdominal pain. EXAM: PORTABLE CHEST 1 VIEW COMPARISON:  March 17, 2016 FINDINGS: The heart size and mediastinal contours are within normal limits. Moderate to marked severity calcification of the thoracic aorta is noted. Mild, chronic appearing increased lung markings are seen without evidence of acute infiltrate, pleural effusion or pneumothorax. Multilevel degenerative changes seen throughout the thoracic spine. IMPRESSION: No active disease. Electronically Signed   By: Virgina Norfolk M.D.   On: 07/01/2022 01:06    ED COURSE and MDM  Nursing notes, initial and subsequent vitals signs, including pulse oximetry, reviewed and interpreted by myself.  Vitals:   07/01/22 0345 07/01/22 0400 07/01/22 0600 07/01/22 0605  BP: (!) 163/65 (!) 173/75 (!) 152/63   Pulse: 67 67 62   Resp: '20 19 15   '$ Temp:    98.2 F (36.8 C)  TempSrc:    Oral  SpO2: 97% 97% 96%    Medications  rosuvastatin (CRESTOR) tablet 10 mg (has no administration in time range)  donepezil (ARICEPT) tablet 10 mg (has no administration in time range)  memantine (NAMENDA) tablet 10 mg (10 mg Oral Given 07/01/22 0355)  sertraline (ZOLOFT) tablet 100 mg (has no administration in time range)  divalproex (DEPAKOTE) DR tablet 250 mg (250 mg Oral Given 07/01/22 0409)   stroke: early stages of recovery book (has no administration in time range)  acetaminophen (TYLENOL) tablet 650 mg (has no administration in time range)    Or  acetaminophen  (TYLENOL) 160 MG/5ML solution 650 mg (has no administration in time range)    Or  acetaminophen (TYLENOL) suppository 650 mg (has no administration in time range)  heparin injection 5,000 Units (5,000 Units Subcutaneous Given 07/01/22 0652)  ondansetron (ZOFRAN) injection 4 mg (  has no administration in time range)  aspirin EC tablet 81 mg (has no administration in time range)  diazepam (VALIUM) injection 5 mg (has no administration in time range)  aspirin tablet 325 mg (325 mg Oral Given 07/01/22 0356)    2:24 AM Patient positive for COVID infection.  No evidence of acute stroke on CT scan.  No evidence of pneumonia or urinary tract infection.  PROCEDURES  Procedures   ED DIAGNOSES     ICD-10-CM   1. COVID-19 virus infection  U07.1     2. Generalized weakness  R53.1          Ashford Clouse, Jenny Reichmann, MD 07/01/22 [phone]

## 2022-07-01 NOTE — Assessment & Plan Note (Addendum)
-  Fasting lipid panel with LDL of 55, total cholesterol 130.   -Held Crestor for now as patient has been started on Paxlovid and patient also presented with generalized weakness. -Resume Crestor once patient has completed course of Paxlovid.

## 2022-07-01 NOTE — Assessment & Plan Note (Addendum)
Admitting physician verified DNR with pt's dtr beverly.

## 2022-07-01 NOTE — Evaluation (Signed)
Clinical/Bedside Swallow Evaluation Patient Details  Name: Sue Olsen MRN: [id number] Date of Birth: November 20, 1940  Today's Date: 07/01/2022 Time: SLP Start Time (ACUTE ONLY): 5 SLP Stop Time (ACUTE ONLY): 1401 SLP Time Calculation (min) (ACUTE ONLY): 32 min  Past Medical History:  Past Medical History:  Diagnosis Date   Dementia ([place])    Hyperlipidemia    Past Surgical History: History reviewed. No pertinent surgical history. HPI:  Per MD note "82 year old Caucasian female history of vascular dementia/Alzheimer type dementia, history of hyperlipidemia, prior history of stroke who presents to the ER today with inability to walk.  Family states that she has had worsening shuffling gait over the last several weeks but today was unable to walk even with a walker.  She has not had any upper respiratory symptoms.  She was mildly febrile on arrival with a temp of 100.2."  Pt also has h/o fall 12/2021 and MD office visit note from 02/2022 indicates pt with progressive cognitive decline.  Brain imaging showed "Brain atrophy and remote left frontal infarct."   Cognitive decline noted at md appt 02/2022 and pt suffered from a fall 12/2021.  Swallow and speech eval ordered.  Md note indicates pt resides at [facility] memory care and caregivers come to give pt medication and help with ADLs morning and night.    Assessment / Plan / Recommendation  Clinical Impression  Patient presents with functional oropharyngeal swallow based on clinical swallow evaluation. No clinical indication of dysphagia or aspiration. Pt able to hold her own cup and feed herself finger foods *ie graham cracker.  Swallow clinically judged to be timely and strong without evidence of retention.  She can feed herself finger foods but needs assist with use of utensils.  Daughter and pt's spouse deny pt having issues with dysphagia.  Educated pt's family to recommendations to encouraged pt to self feed for safety, discussed  gustatory changes in sequale of dementia and potential compensation strategies.   Intake was minimal during session and large portion of her meal is left on her food tray, daughter advised her mother normally is a "good eater" without choking or coughing. SLP Visit Diagnosis: Dysphagia, unspecified (R13.10)    Aspiration Risk  Mild aspiration risk    Diet Recommendation Regular;Thin liquid   Liquid Administration via: Cup Medication Administration: Whole meds with liquid Supervision: Patient able to self feed Compensations: Slow rate;Small sips/bites Postural Changes: Seated upright at 90 degrees;Remain upright for at least 30 minutes after po intake    Other  Recommendations Oral Care Recommendations: Oral care BID    Recommendations for follow up therapy are one component of a multi-disciplinary discharge planning process, led by the attending physician.  Recommendations may be updated based on patient status, additional functional criteria and insurance authorization.  Follow up Recommendations No SLP follow up      Assistance Recommended at Discharge  N/A  Functional Status Assessment Patient has not had a recent decline in their functional status  Frequency and Duration   none         Prognosis    N/a    Swallow Study   General Date of Onset: 07/01/22 HPI: Per MD note "82 year old Caucasian female history of vascular dementia/Alzheimer type dementia, history of hyperlipidemia, prior history of stroke who presents to the ER today with inability to walk.  Family states that she has had worsening shuffling gait over the last several weeks but today was unable to walk even with a walker.  She  has not had any upper respiratory symptoms.  She was mildly febrile on arrival with a temp of 100.2."  Pt also has h/o fall 12/2021 and MD office visit note from 02/2022 indicates pt with progressive cognitive decline.  Brain imaging showed "Brain atrophy and remote left frontal infarct."    Cognitive decline noted at md appt 02/2022 and pt suffered from a fall 12/2021.  Swallow and speech eval ordered.  Md note indicates pt resides at [facility] memory care and caregivers come to give pt medication and help with ADLs morning and night. Type of Study: Bedside Swallow Evaluation Previous Swallow Assessment: MBS 2018, normal study, barium tablet temporarily halted at aortic valve but then cleared, esophagram 2018 showed mild reflux but no hiatal hernia Diet Prior to this Study: Regular;Thin liquids (Level 0) Temperature Spikes Noted: No Respiratory Status: Room air History of Recent Intubation: No Behavior/Cognition: Alert;Cooperative Oral Cavity Assessment: Within Functional Limits Oral Care Completed by SLP: No Oral Cavity - Dentition: Adequate natural dentition Vision: Functional for self-feeding Self-Feeding Abilities: Needs assist Patient Positioning: Upright in bed Baseline Vocal Quality: Low vocal intensity Volitional Cough: Cognitively unable to elicit Volitional Swallow: Unable to elicit    Oral/Motor/Sensory Function Overall Oral Motor/Sensory Function: Within functional limits   Ice Chips Ice chips: Not tested   Thin Liquid Thin Liquid: Within functional limits Presentation: Straw    Nectar Thick Nectar Thick Liquid: Not tested   Honey Thick Honey Thick Liquid: Not tested   Puree Puree: Within functional limits Presentation: Spoon   Solid     Solid: Within functional limits Presentation: Self Fredirick Lathe 07/01/2022,3:29 PM   Kathleen Lime, MS [place] Office [phone]

## 2022-07-01 NOTE — H&P (Signed)
History and Physical    Sue DRAUDT [id number] DOB: 1940-06-12 DOA: 07/01/2022  DOS: the patient was seen and examined on 07/01/2022  PCP: Sue Olsen., MD   Patient coming from: ALF: Sue Olsen  I have personally briefly reviewed patient's old medical records in [place]  CC: difficulty walking, incontinent of urine HPI: 82 year old Caucasian female history of vascular dementia/Alzheimer type dementia, history of hyperlipidemia, prior history of stroke who presents to the ER today with inability to walk.  Family states that she has had worsening shuffling gait over the last several weeks but today was unable to walk even with a walker.  She has not had any upper respiratory symptoms.  She was mildly febrile on arrival with a temp of 100.2.  Daughter states that patient lives at [facility].  This is a independent living senior community.  She lives with her husband.  They have a caregiver that helps get the patient changed.  Daughter states that patient participated in all the community activities yesterday.  Patient did have a episode of incontinence of urine in her bed which is unusual for her.  This happened on Monday night/early Tuesday morning.  On arrival temp 98.3 heart rate 63 blood pressure 162/104 satting 99% on room air.  Labs:  UA negative for leukocyte esterase or nitrates.  COVID test was positive.  Influenza negative, RSV negative  Sodium 137, potassium 4.0, BUN of 20, creatinine 0.74  White count 7.4, hemoglobin 11.7, platelets of 117  Chest x-ray showed no acute cardiopulmonary disease  CT head was negative for acute intracranial abnormality.  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  [facility] hospital[facility] contacted for admission.   ED Course: CT head negative for acute CVA, Covid was POSITIVE  Review of Systems:  Review of Systems  Unable to perform ROS: Dementia    Past Medical History:  Diagnosis Date   Dementia ([place])     Hyperlipidemia     No past surgical history on file.   reports that she has never smoked. She has never used smokeless tobacco. She reports that she does not drink alcohol and does not use drugs.  Allergies  Allergen Reactions   Cephalexin Other (See Comments)    Unk reaction   Cephalosporins Other (See Comments)    Unk reaction   Azithromycin Nausea Only    No family history on file.  Prior to Admission medications   Medication Sig Start Date End Date Taking? Authorizing Provider  divalproex (DEPAKOTE) 250 MG DR tablet Take 250 mg by mouth 2 (two) times daily. 11/19/21   [provider]  donepezil (ARICEPT) 10 MG tablet Take 1 tablet (10 mg total) by mouth daily. 08/24/21   Rondel Jumbo, PA-C  memantine (NAMENDA) 10 MG tablet TAKE ONE TABLET TWICE DAILY 02/01/22   Rondel Jumbo, PA-C  rosuvastatin (CRESTOR) 10 MG tablet Take 10 mg by mouth daily. 04/20/21   [provider]  sertraline (ZOLOFT) 100 MG tablet Take 100 mg by mouth daily. 11/19/21   [provider]    Physical Exam: Vitals:   07/01/22 0017 07/01/22 0200  BP: (!) 162/104 (!) 175/68  Pulse: 63 68  Resp: 17 13  Temp: 98.3 F (36.8 C) 98.2 F (36.8 C)  TempSrc: Oral   SpO2: 99% 99%    Physical Exam Vitals and nursing note reviewed.  Constitutional:      General: She is not in acute distress.    Appearance: She is  not toxic-appearing or diaphoretic.  HENT:     Head: Normocephalic and atraumatic.     Nose: Nose normal.  Eyes:     General: No scleral icterus. Cardiovascular:     Rate and Rhythm: Normal rate and regular rhythm.     Pulses: Normal pulses.  Pulmonary:     Effort: Pulmonary effort is normal.     Breath sounds: Normal breath sounds.  Abdominal:     General: Bowel sounds are normal. There is no distension.     Tenderness: There is no abdominal tenderness.  Musculoskeletal:     Right lower leg: 1+ Edema present.     Left lower leg: 1+ Edema present.     Comments:  +1 pitting bilateral pretibial, ankle edema  Skin:    General: Skin is warm and dry.     Capillary Refill: Capillary refill takes less than 2 seconds.  Neurological:     Mental Status: She is disoriented.      Labs on Admission: I have personally reviewed following labs and imaging studies  CBC: Recent Labs  Lab 07/01/22 0057  WBC 7.4  NEUTROABS 4.8  HGB 11.7*  HCT 36.1  MCV 94.0  PLT 123XX123*   Basic Metabolic Panel: Recent Labs  Lab 07/01/22 0057  NA 137  K 4.0  CL 103  CO2 25  GLUCOSE 99  BUN 20  CREATININE 0.74  CALCIUM 8.8*   GFR: CrCl cannot be calculated (Unknown ideal weight.). Liver Function Tests: No results for input(s): "AST", "ALT", "ALKPHOS", "BILITOT", "PROT", "ALBUMIN" in the last 168 hours. No results for input(s): "LIPASE", "AMYLASE" in the last 168 hours. No results for input(s): "AMMONIA" in the last 168 hours. Coagulation Profile: No results for input(s): "INR", "PROTIME" in the last 168 hours. Cardiac Enzymes: Recent Labs  Lab 07/01/22 0057  TROPONINIHS 6   BNP (last 3 results) No results for input(s): "BNP" in the last 8760 hours. HbA1C: No results for input(s): "HGBA1C" in the last 72 hours. CBG: No results for input(s): "GLUCAP" in the last 168 hours. Lipid Profile: No results for input(s): "CHOL", "HDL", "LDLCALC", "TRIG", "CHOLHDL", "LDLDIRECT" in the last 72 hours. Thyroid Function Tests: No results for input(s): "TSH", "T4TOTAL", "FREET4", "T3FREE", "THYROIDAB" in the last 72 hours. Anemia Panel: No results for input(s): "VITAMINB12", "FOLATE", "FERRITIN", "TIBC", "IRON", "RETICCTPCT" in the last 72 hours. Urine analysis:    Component Value Date/Time   COLORURINE YELLOW 07/01/2022 0158   APPEARANCEUR CLEAR 07/01/2022 0158   LABSPEC 1.019 07/01/2022 0158   PHURINE 7.0 07/01/2022 0158   GLUCOSEU NEGATIVE 07/01/2022 0158   HGBUR SMALL (A) 07/01/2022 0158   BILIRUBINUR NEGATIVE 07/01/2022 0158   KETONESUR 5 (A) 07/01/2022  0158   PROTEINUR NEGATIVE 07/01/2022 0158   NITRITE NEGATIVE 07/01/2022 0158   LEUKOCYTESUR NEGATIVE 07/01/2022 0158    Radiological Exams on Admission: I have personally reviewed images CT Head Wo Contrast  Result Date: 07/01/2022 CLINICAL DATA:  Delirium EXAM: CT HEAD WITHOUT CONTRAST TECHNIQUE: Contiguous axial images were obtained from the base of the skull through the vertex without intravenous contrast. RADIATION DOSE REDUCTION: This exam was performed according to the departmental dose-optimization program which includes automated exposure control, adjustment of the mA and/or kV according to patient size and/or use of iterative reconstruction technique. COMPARISON:  01/16/2022 FINDINGS: Brain: There is no mass, hemorrhage or extra-axial collection. There is generalized atrophy without lobar predilection. Hypodensity of the white matter is most commonly associated with chronic microvascular disease. Old inferior left frontal  infarct. Vascular: No abnormal hyperdensity of the major intracranial arteries or dural venous sinuses. No intracranial atherosclerosis. Skull: The visualized skull base, calvarium and extracranial soft tissues are normal. Sinuses/Orbits: No fluid levels or advanced mucosal thickening of the visualized paranasal sinuses. No mastoid or middle ear effusion. The orbits are normal. IMPRESSION: 1. No acute intracranial abnormality. 2. Old inferior left frontal infarct and findings of chronic microvascular disease. Electronically Signed   By: Ulyses Jarred M.D.   On: 07/01/2022 01:45   DG Chest Port 1 View  Result Date: 07/01/2022 CLINICAL DATA:  Incontinence with weakness and abdominal pain. EXAM: PORTABLE CHEST 1 VIEW COMPARISON:  March 17, 2016 FINDINGS: The heart size and mediastinal contours are within normal limits. Moderate to marked severity calcification of the thoracic aorta is noted. Mild, chronic appearing increased lung markings are seen without evidence of acute  infiltrate, pleural effusion or pneumothorax. Multilevel degenerative changes seen throughout the thoracic spine. IMPRESSION: No active disease. Electronically Signed   By: Virgina Norfolk M.D.   On: 07/01/2022 01:06    EKG: My personal interpretation of EKG shows: NSR    Assessment/Plan Principal Problem:   TIA (transient ischemic attack) Active Problems:   COVID-19 virus infection   Hyperlipidemia   Dementia, vascular, mixed, with behavioral disturbance (HCC)   DNR (do not resuscitate)/DNI(Do Not Intubate)    Assessment and Plan: * TIA (transient ischemic attack) Observation med/tele bed. Check MRI brain. Check carotid U/S. Check echo. Check lipid panel. Asa 325 mg ordered.  Allow for permissive hypertension until MRI is completed.  Keep systolic blood pressure less than [id number] and diastolic less than [id number].  COVID-19 virus infection Incidental. Pt without any respiratory symptoms.  Dementia, vascular, mixed, with behavioral disturbance (HCC) Chronic. Continue with depakote 250 mg bid, zoloft 100 mg daily, aricept 10 mg daily, namenda 10 mg bid.  Hyperlipidemia Check lipid panel. Continue with crestor 10 mg daily.  DNR (do not resuscitate)/DNI(Do Not Intubate) Verified DNR with pt's dtr beverly.   DVT prophylaxis: SQ Heparin Code Status: DNR/DNI(Do NOT Intubate). Verified with pt's dtr beverly Family Communication: discussed with pt's dtr beverly and pt's husband hurley at bedside  Disposition Plan: return to ALF  Consults called: none  Admission status: Observation, Telemetry bed   Kristopher Oppenheim, DO [facility] 07/01/2022, 3:04 AM

## 2022-07-01 NOTE — Assessment & Plan Note (Addendum)
-  Patient noted to be clinically dehydrated on examination.   -Patient hydrated with IV fluids.   -Saline lock IV fluids.

## 2022-07-01 NOTE — Assessment & Plan Note (Addendum)
Chronic.  -Patient with some agitation per RN. Continue with depakote 250 mg bid, zoloft 100 mg daily, aricept 10 mg daily, namenda 10 mg bid. -Patient received a dose of Zyprexa on 07/03/2022 as well as the morning of 07/04/2022.   -Due to concern for worsening parkinsonism with antipsychotics, Zyprexa has been discontinued and patient will be placed on Seroquel 12.5 mg p.o. twice daily.  Discussed with neurology.

## 2022-07-01 NOTE — ED Notes (Signed)
Pt Hx of dementia. NIH score not obtained

## 2022-07-01 NOTE — Assessment & Plan Note (Addendum)
Incidental.  -Patient without any significant respiratory symptoms on admission however during my assessment patient with an occasional cough. -Patient with extremely dry mucous membranes. -Cycle threshold noted at 29.2. -CRP < 0.5. -Due to patient's age, underlying dementia, significant dehydration patient was started on Paxlovid as patient has an increased risk for deterioration. -Place on vitamin C and zinc. -Tessalon Perles 100 mg 3 times daily. -Supportive care.

## 2022-07-01 NOTE — Subjective & Objective (Signed)
CC: difficulty walking, incontinent of urine HPI: 82 year old Caucasian female history of vascular dementia/Alzheimer type dementia, history of hyperlipidemia, prior history of stroke who presents to the ER today with inability to walk.  Family states that she has had worsening shuffling gait over the last several weeks but today was unable to walk even with a walker.  She has not had any upper respiratory symptoms.  She was mildly febrile on arrival with a temp of 100.2.  Daughter states that patient lives at [facility].  This is a independent living senior community.  She lives with her husband.  They have a caregiver that helps get the patient changed.  Daughter states that patient participated in all the community activities yesterday.  Patient did have a episode of incontinence of urine in her bed which is unusual for her.  This happened on Monday night/early Tuesday morning.  On arrival temp 98.3 heart rate 63 blood pressure 162/104 satting 99% on room air.  Labs:  UA negative for leukocyte esterase or nitrates.  COVID test was positive.  Influenza negative, RSV negative  Sodium 137, potassium 4.0, BUN of 20, creatinine 0.74  White count 7.4, hemoglobin 11.7, platelets of 117  Chest x-ray showed no acute cardiopulmonary disease  CT head was negative for acute intracranial abnormality.  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  [facility] hospitalist contacted for admission.

## 2022-07-01 NOTE — ED Notes (Signed)
Pt passed swallow screen

## 2022-07-01 NOTE — Progress Notes (Addendum)
PROGRESS NOTE    Sue Olsen  [id number] DOB: [birth year] DOA: 07/01/2022 PCP: Ginger Organ., MD    Chief Complaint  Sue Olsen presents with   Weakness    Brief Narrative:  HPI per Dr. [name] 82 year old Caucasian female history of vascular dementia/Alzheimer type dementia, history of hyperlipidemia, prior history of stroke who presents to the ER today with inability to walk.  Family states that Sue Olsen has had worsening shuffling gait over the last several weeks but today was unable to walk even with a walker.  Sue Olsen has not had any upper respiratory symptoms.  Sue Olsen was mildly febrile on arrival with a temp of 100.2.  Daughter states that Sue Olsen lives at [facility].  This is a independent living senior community.  Sue Olsen lives with Sue Olsen husband.  They have a caregiver that helps get the Sue Olsen changed.  Daughter states that Sue Olsen participated in all the community activities yesterday.  Sue Olsen did have a episode of incontinence of urine in Sue Olsen bed which is unusual for Sue Olsen.  This happened on Monday night/early Tuesday morning.   On arrival temp 98.3 heart rate 63 blood pressure 162/104 satting 99% on room air.   Labs:   UA negative for leukocyte esterase or nitrates.   COVID test was positive.  Influenza negative, RSV negative   Sodium 137, potassium 4.0, BUN of 20, creatinine 0.74   White count 7.4, hemoglobin 11.7, platelets of 117   Chest x-ray showed no acute cardiopulmonary disease   CT head was negative for acute intracranial abnormality.  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   [facility] hospital[facility] contacted for admission.    ED Course: CT head negative for acute CVA, Covid was POSITIVE      Assessment & Plan:  Principal Problem:   Generalized weakness Active Problems:   COVID-19 virus infection   Hyperlipidemia   Dementia, vascular, mixed, with behavioral disturbance (HCC)   DNR (do not resuscitate)/DNI(Do Not Intubate)   Dehydration    Assessment and  Plan: * Generalized weakness -Sue Olsen admitted with concerns of TIA versus CVA due to significant generalized lower extremity weakness.   -Head CT done with no acute intracranial abnormality.  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  -MRI brain done negative for acute abnormalities, brain atrophy and remote left frontal infarct.   -Carotid Dopplers pending.   -2D echo pending.   -Fasting lipid panel with cholesterol of 130, HDL of 61, LDL of 55.   -Initially due to concern for acute CVA permissive hypertension was allowed however MRI negative and as such we will place on low-dose Norvasc for better blood pressure control.   -Case discussed with neurology who does not feel anything from their standpoint at this time.   -Placed on IV fluids, PT/OT/ST.   -Supportive care.   COVID-19 virus infection Incidental.  -Sue Olsen without any significant respiratory symptoms on admission however during my assessment Sue Olsen with an occasional cough. -Sue Olsen with extremely dry mucous membranes. -Cycle threshold noted at 29.2. -CRP done at less than 0.5. -Due to Sue Olsen's age, underlying dementia, significant dehydration will place Sue Olsen on Paxlovid as Sue Olsen has an increased risk for deterioration. -Place on vitamin C and zinc. -Supportive care.  Dementia, vascular, mixed, with behavioral disturbance (HCC) Chronic.  Continue with depakote 250 mg bid, zoloft 100 mg daily, aricept 10 mg daily, namenda 10 mg bid.  Hyperlipidemia -Fasting lipid panel with LDL of 55, total cholesterol 130.   -Discontinue Crestor  for now as Sue Olsen has been started on Paxlovid and Sue Olsen also presented with generalized weakness.  Dehydration -Sue Olsen clinically dehydrated on examination. -Place on gentle hydration. -Supportive care.  DNR (do not resuscitate)/DNI(Do Not Intubate) Admitting physician verified DNR with pt's dtr beverly.         DVT prophylaxis:  Heparin Code Status: DNR Family Communication: Updated husband at bedside.  Updated daughter Alvy Beal at bedside. Disposition: TBD  Status is: Inpatient Remains inpatient appropriate because: Severity of illness   Consultants:  None  Procedures:  Chest x-ray 07/01/2022   Antimicrobials:  Paxlovid 07/01/2022>>>>   Subjective: Laying on gurney.  Denies any chest pain.  No shortness of breath.  No abdominal pain.  Still with significant generalized weakness.  Husband at bedside.  Occasional nonproductive cough.  Objective: Vitals:   07/01/22 1330 07/01/22 1345 07/01/22 1400 07/01/22 1451  BP: (!) 183/74 (!) 175/66 (!) 165/61 (!) 171/67  Pulse: 64 66 66 65  Resp:   20 16  Temp:   98.5 F (36.9 C) 98.1 F (36.7 C)  TempSrc:    Axillary  SpO2: 99% 99% 99% 97%   No intake or output data in the 24 hours ending 07/01/22 1717 There were no vitals filed for this visit.  Examination:  General exam: Appears calm and comfortable.  Extremely dry mucous membranes.  Pill-rolling tremors noted. Respiratory system: Clear to auscultation bilaterally.  No wheezes, no crackles, no rhonchi.  Fair air movement.  Speaking in full sentences.Marland Kitchen Respiratory effort normal. Cardiovascular system: S1 & S2 heard, RRR. No JVD, murmurs, rubs, gallops or clicks. No pedal edema. Gastrointestinal system: Abdomen is nondistended, soft and nontender. No organomegaly or masses felt. Normal bowel sounds heard. Central nervous system: Alert. No focal neurological deficits. Extremities: Symmetric 5 x 5 power. Skin: No rashes, lesions or ulcers Psychiatry: Judgement and insight appear poor to fair. Mood & affect appropriate.     Data Reviewed:   CBC: Recent Labs  Lab 07/01/22 0057  WBC 7.4  NEUTROABS 4.8  HGB 11.7*  HCT 36.1  MCV 94.0  PLT 117*    Basic Metabolic Panel: Recent Labs  Lab 07/01/22 0057  NA 137  K 4.0  CL 103  CO2 25  GLUCOSE 99  BUN 20  CREATININE 0.74  CALCIUM 8.8*     GFR: CrCl cannot be calculated (Unknown ideal weight.).  Liver Function Tests: No results for input(s): "AST", "ALT", "ALKPHOS", "BILITOT", "PROT", "ALBUMIN" in the last 168 hours.  CBG: No results for input(s): "GLUCAP" in the last 168 hours.   Recent Results (from the past 240 hour(s))  Resp panel by RT-PCR (RSV, Flu A&B, Covid) Anterior Nasal Swab     Status: Abnormal   Collection Time: 07/01/22 12:57 AM   Specimen: Anterior Nasal Swab  Result Value Ref Range Status   SARS Coronavirus 2 by RT PCR POSITIVE (A) NEGATIVE Final    Comment: (NOTE) SARS-CoV-2 target nucleic acids are DETECTED.  The SARS-CoV-2 RNA is generally detectable in upper respiratory specimens during the acute phase of infection. Positive results are indicative of the presence of the identified virus, but do not rule out bacterial infection or co-infection with other pathogens not detected by the test. Clinical correlation with Sue Olsen history and other diagnostic information is necessary to determine Sue Olsen infection status. The expected result is Negative.  Fact Sheet for Patients: EntrepreneurPulse.com.au  Fact Sheet for Healthcare Providers: IncredibleEmployment.be  This test is not yet approved or cleared by the [place] FDA  and  has been authorized for detection and/or diagnosis of SARS-CoV-2 by FDA under an Emergency Use Authorization (EUA).  This EUA will remain in effect (meaning this test can be used) for the duration of  the COVID-19 declaration under Section 564(b)(1) of the A ct, 21 U.S.C. section 360bbb-3(b)(1), unless the authorization is terminated or revoked sooner.     Influenza A by PCR NEGATIVE NEGATIVE Final   Influenza B by PCR NEGATIVE NEGATIVE Final    Comment: (NOTE) The Xpert Xpress SARS-CoV-2/FLU/RSV plus assay is intended as an aid in the diagnosis of influenza from Nasopharyngeal swab specimens and should not be used as a  sole basis for treatment. Nasal washings and aspirates are unacceptable for Xpert Xpress SARS-CoV-2/FLU/RSV testing.  Fact Sheet for Patients: EntrepreneurPulse.com.au  Fact Sheet for Healthcare Providers: IncredibleEmployment.be  This test is not yet approved or cleared by the [place] FDA and has been authorized for detection and/or diagnosis of SARS-CoV-2 by FDA under an Emergency Use Authorization (EUA). This EUA will remain in effect (meaning this test can be used) for the duration of the COVID-19 declaration under Section 564(b)(1) of the Act, 21 U.S.C. section 360bbb-3(b)(1), unless the authorization is terminated or revoked.     Resp Syncytial Virus by PCR NEGATIVE NEGATIVE Final    Comment: (NOTE) Fact Sheet for Patients: EntrepreneurPulse.com.au  Fact Sheet for Healthcare Providers: IncredibleEmployment.be  This test is not yet approved or cleared by the [place] FDA and has been authorized for detection and/or diagnosis of SARS-CoV-2 by FDA under an Emergency Use Authorization (EUA). This EUA will remain in effect (meaning this test can be used) for the duration of the COVID-19 declaration under Section 564(b)(1) of the Act, 21 U.S.C. section 360bbb-3(b)(1), unless the authorization is terminated or revoked.  Performed at [facility], [place] [address] [place], [place] [address]          Radiology Studies: VAS US CAROTID (at [facility] and [facility] only)  Result Date: 07/01/2022 Carotid Arterial Duplex Study Sue Olsen Name:  BRIGITTE HOPP [facility]  Date of Exam:   07/01/2022 Medical Rec #: [id number]           Accession #:    [id number] Date of Birth: [birth year]           Sue Olsen Gender: F Sue Olsen Age:   5 years Exam Location:  [facility] Procedure:      VAS US CAROTID Referring Phys: ERIC CHEN --------------------------------------------------------------------------------   Indications:       TIA. Risk Factors:      Hyperlipidemia, prior CVA. Other Factors:     Vascular dementia, Covid-19. Comparison Study:  No prior studies. Performing Technologist: Darlin Coco RDMS, RVT  Examination Guidelines: A complete evaluation includes B-mode imaging, spectral Doppler, color Doppler, and power Doppler as needed of all accessible portions of each vessel. Bilateral testing is considered an integral part of a complete examination. Limited examinations for reoccurring indications may be performed as noted.  Right Carotid Findings: +----------+--------+--------+--------+------------------+--------+           PSV cm/sEDV cm/sStenosisPlaque DescriptionComments +----------+--------+--------+--------+------------------+--------+ CCA Prox  86      9                                          +----------+--------+--------+--------+------------------+--------+ CCA Distal75      9                                          +----------+--------+--------+--------+------------------+--------+  ICA Prox  78      9               heterogenous mild          +----------+--------+--------+--------+------------------+--------+ ICA Mid   74      11                                         +----------+--------+--------+--------+------------------+--------+ ICA Distal73      15                                         +----------+--------+--------+--------+------------------+--------+ ECA       63                                                 +----------+--------+--------+--------+------------------+--------+ +----------+--------+-------+----------------+-------------------+           PSV cm/sEDV cmsDescribe        Arm Pressure (mmHG) +----------+--------+-------+----------------+-------------------+ [id number]            Multiphasic, WNL                    +----------+--------+-------+----------------+-------------------+  +---------+--------+--+--------+--+---------+ VertebralPSV cm/s86EDV cm/s14Antegrade +---------+--------+--+--------+--+---------+  Left Carotid Findings: +----------+--------+--------+--------+------------------+------------------+           PSV cm/sEDV cm/sStenosisPlaque DescriptionComments           +----------+--------+--------+--------+------------------+------------------+ CCA Prox  95      9                                                    +----------+--------+--------+--------+------------------+------------------+ CCA Distal71      9                                                    +----------+--------+--------+--------+------------------+------------------+ ICA Prox  90      14                                intimal thickening +----------+--------+--------+--------+------------------+------------------+ ICA Mid   70      11                                                   +----------+--------+--------+--------+------------------+------------------+ ICA Distal79      8                                                    +----------+--------+--------+--------+------------------+------------------+ ECA       114     7  heterogenous mild                    +----------+--------+--------+--------+------------------+------------------+ +----------+--------+--------+----------------+-------------------+           PSV cm/sEDV cm/sDescribe        Arm Pressure (mmHG) +----------+--------+--------+----------------+-------------------+ [id number]             Multiphasic, WNL                    +----------+--------+--------+----------------+-------------------+ +---------+--------+--+--------+--+---------+ VertebralPSV cm/s71EDV cm/s10Antegrade +---------+--------+--+--------+--+---------+   Summary: Right Carotid: The extracranial vessels were near-normal with only minimal wall                thickening or plaque. Left  Carotid: The extracranial vessels were near-normal with only minimal wall               thickening or plaque. Vertebrals:  Bilateral vertebral arteries demonstrate antegrade flow. Subclavians: Normal flow hemodynamics were seen in bilateral subclavian              arteries. *See table(s) above for measurements and observations.     Preliminary    MR BRAIN WO CONTRAST  Result Date: 07/01/2022 CLINICAL DATA:  TIA.  Weakness with walking. EXAM: MRI HEAD WITHOUT CONTRAST TECHNIQUE: Multiplanar, multiecho pulse sequences of the brain and surrounding structures were obtained without intravenous contrast. COMPARISON:  Head CT from earlier today FINDINGS: Brain: No acute infarction, hemorrhage, hydrocephalus, extra-axial collection or mass lesion. Chronic left anterior frontal infarct affecting cortex. Mild chronic small vessel ischemia in the cerebral white matter. Cerebral volume loss especially affecting the anterior temporal lobes. Vascular: Normal flow voids. Skull and upper cervical spine: Normal marrow signal. Sinuses/Orbits: Negative. IMPRESSION: 1. No acute or reversible finding. 2. Brain atrophy and remote left frontal infarct. Electronically Signed   By: Jorje Guild M.D.   On: 07/01/2022 06:39   CT Head Wo Contrast  Result Date: 07/01/2022 CLINICAL DATA:  Delirium EXAM: CT HEAD WITHOUT CONTRAST TECHNIQUE: Contiguous axial images were obtained from the base of the skull through the vertex without intravenous contrast. RADIATION DOSE REDUCTION: This exam was performed according to the departmental dose-optimization program which includes automated exposure control, adjustment of the mA and/or kV according to Sue Olsen size and/or use of iterative reconstruction technique. COMPARISON:  01/16/2022 FINDINGS: Brain: There is no mass, hemorrhage or extra-axial collection. There is generalized atrophy without lobar predilection. Hypodensity of the white matter is most commonly associated with chronic  microvascular disease. Old inferior left frontal infarct. Vascular: No abnormal hyperdensity of the major intracranial arteries or dural venous sinuses. No intracranial atherosclerosis. Skull: The visualized skull base, calvarium and extracranial soft tissues are normal. Sinuses/Orbits: No fluid levels or advanced mucosal thickening of the visualized paranasal sinuses. No mastoid or middle ear effusion. The orbits are normal. IMPRESSION: 1. No acute intracranial abnormality. 2. Old inferior left frontal infarct and findings of chronic microvascular disease. Electronically Signed   By: Ulyses Jarred M.D.   On: 07/01/2022 01:45   DG Chest Port 1 View  Result Date: 07/01/2022 CLINICAL DATA:  Incontinence with weakness and abdominal pain. EXAM: PORTABLE CHEST 1 VIEW COMPARISON:  March 17, 2016 FINDINGS: The heart size and mediastinal contours are within normal limits. Moderate to marked severity calcification of the thoracic aorta is noted. Mild, chronic appearing increased lung markings are seen without evidence of acute infiltrate, pleural effusion or pneumothorax. Multilevel degenerative changes seen throughout the thoracic spine. IMPRESSION: No active disease. Electronically Signed   By: Hoover Browns  Houston M.D.   On: 07/01/2022 01:06        Scheduled Meds:  [START ON 07/02/2022]  stroke: early stages of recovery book   Does not apply Once   [START ON 07/02/2022] amLODipine  2.5 mg Oral Daily   [START ON 07/02/2022] aspirin EC  81 mg Oral Daily   divalproex  250 mg Oral BID   donepezil  10 mg Oral Daily   heparin  5,000 Units Subcutaneous Q8H   memantine  10 mg Oral BID   nirmatrelvir/ritonavir  3 tablet Oral BID   sertraline  100 mg Oral Daily   Continuous Infusions:  sodium chloride 100 mL/hr at 07/01/22 1311     LOS: 0 days    Time spent: 40 minutes  No charge.    Irine Seal, MD [facility]   To contact the attending provider between 7A-7P or the covering provider  during after hours 7P-7A, please log into the web site [email or web address] and access using universal [place] password for that web site. If you do not have the password, please call the hospital operator.  07/01/2022, 5:17 PM

## 2022-07-01 NOTE — ED Notes (Signed)
Pt. External urinary catheter applied and connected to suction.

## 2022-07-01 NOTE — Hospital Course (Signed)
HPI per Dr. [name] 82 year old Caucasian female history of vascular dementia/Alzheimer type dementia, history of hyperlipidemia, prior history of stroke who presents to the ER today with inability to walk.  Family states that she has had worsening shuffling gait over the last several weeks but today was unable to walk even with a walker.  She has not had any upper respiratory symptoms.  She was mildly febrile on arrival with a temp of 100.2.  Daughter states that patient lives at [facility].  This is a independent living senior community.  She lives with her husband.  They have a caregiver that helps get the patient changed.  Daughter states that patient participated in all the community activities yesterday.  Patient did have a episode of incontinence of urine in her bed which is unusual for her.  This happened on Monday night/early Tuesday morning.   On arrival temp 98.3 heart rate 63 blood pressure 162/104 satting 99% on room air.   Labs:   UA negative for leukocyte esterase or nitrates.   COVID test was positive.  Influenza negative, RSV negative   Sodium 137, potassium 4.0, BUN of 20, creatinine 0.74   White count 7.4, hemoglobin 11.7, platelets of 117   Chest x-ray showed no acute cardiopulmonary disease   CT head was negative for acute intracranial abnormality.  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   [facility] hospitalist contacted for admission.    ED Course: CT head negative for acute CVA, Covid was POSITIVE

## 2022-07-02 ENCOUNTER — Inpatient Hospital Stay (HOSPITAL_COMMUNITY): Payer: Medicare Other

## 2022-07-02 DIAGNOSIS — G459 Transient cerebral ischemic attack, unspecified: Secondary | ICD-10-CM | POA: Diagnosis not present

## 2022-07-02 DIAGNOSIS — U071 COVID-19: Secondary | ICD-10-CM | POA: Diagnosis not present

## 2022-07-02 DIAGNOSIS — R531 Weakness: Secondary | ICD-10-CM | POA: Diagnosis not present

## 2022-07-02 LAB — BASIC METABOLIC PANEL
Anion gap: 11 (ref 5–15)
BUN: 12 mg/dL (ref 8–23)
CO2: 26 mmol/L (ref 22–32)
Calcium: 8.5 mg/dL — ABNORMAL LOW (ref 8.9–10.3)
Chloride: 101 mmol/L (ref 98–111)
Creatinine, Ser: 0.65 mg/dL (ref 0.44–1.00)
GFR, Estimated: >60 mL/min (ref >60)
Glucose, Bld: 90 mg/dL (ref 70–99)
Potassium: 3.9 mmol/L (ref 3.5–5.1)
Sodium: 138 mmol/L (ref 135–145)

## 2022-07-02 LAB — ECHOCARDIOGRAM COMPLETE
Area-P 1/2: 2.83 cm2
Calc EF: 58.6 %
S' Lateral: 2.9 cm
Single Plane A2C EF: 58.6 %
Single Plane A4C EF: 59.9 %
Weight: 1590.84 oz

## 2022-07-02 LAB — CBC WITH DIFFERENTIAL/PLATELET
Abs Immature Granulocytes: 0.03 10*3/uL (ref 0.00–0.07)
Basophils Absolute: 0 10*3/uL (ref 0.0–0.1)
Basophils Relative: 1 %
Eosinophils Absolute: 0 10*3/uL (ref 0.0–0.5)
Eosinophils Relative: 1 %
HCT: 35.2 % — ABNORMAL LOW (ref 36.0–46.0)
Hemoglobin: 11.3 g/dL — ABNORMAL LOW (ref 12.0–15.0)
Immature Granulocytes: 1 %
Lymphocytes Relative: 48 %
Lymphs Abs: 2.7 10*3/uL (ref 0.7–4.0)
MCH: 30.5 pg (ref 26.0–34.0)
MCHC: 32.1 g/dL (ref 30.0–36.0)
MCV: 94.9 fL (ref 80.0–100.0)
Monocytes Absolute: 0.7 10*3/uL (ref 0.1–1.0)
Monocytes Relative: 12 %
Neutro Abs: 2 10*3/uL (ref 1.7–7.7)
Neutrophils Relative %: 37 %
Platelets: 104 10*3/uL — ABNORMAL LOW (ref 150–400)
RBC: 3.71 MIL/uL — ABNORMAL LOW (ref 3.87–5.11)
RDW: 12.5 % (ref 11.5–15.5)
WBC: 5.5 10*3/uL (ref 4.0–10.5)
nRBC: 0 % (ref 0.0–0.2)

## 2022-07-02 LAB — HEMOGLOBIN A1C
Hgb A1c MFr Bld: 5.7 % — ABNORMAL HIGH (ref 4.8–5.6)
Mean Plasma Glucose: 117 mg/dL

## 2022-07-02 LAB — MAGNESIUM: Magnesium: 2.3 mg/dL (ref 1.7–2.4)

## 2022-07-02 MED ORDER — SODIUM CHLORIDE 0.9 % IV BOLUS
250.0000 mL | Freq: Once | INTRAVENOUS | Status: AC
  Administered 2022-07-02: 250 mL via INTRAVENOUS

## 2022-07-02 MED ORDER — AMLODIPINE BESYLATE 5 MG PO TABS
5.0000 mg | ORAL_TABLET | Freq: Every day | ORAL | Status: DC
  Administered 2022-07-03: 5 mg via ORAL
  Filled 2022-07-02: qty 1

## 2022-07-02 MED ORDER — HYDRALAZINE HCL 20 MG/ML IJ SOLN
10.0000 mg | Freq: Once | INTRAMUSCULAR | Status: AC | PRN
  Administered 2022-07-05: 10 mg via INTRAVENOUS
  Filled 2022-07-02: qty 1

## 2022-07-02 NOTE — Progress Notes (Signed)
Echocardiogram 2D Echocardiogram has been performed.  Sue Olsen 07/02/2022, 9:29 AM

## 2022-07-02 NOTE — TOC Initial Note (Addendum)
Transition of Care [facility]) - Initial/Assessment Note    Patient Details  Name: Sue Olsen MRN: [id number] Date of Birth: Mar 09, 1941  [facility] CM/SW Contact:    Bethann Berkshire, [place] Phone Number: 07/02/2022, 1:34 PM  Clinical Narrative:                  Pt noted to be from [facility]. CSW called [facility] and confirmed pt is from the [facility] level of care. They use [facility] for on-site therapies.   TOC will follow to assist with discharge needs.   Expected Discharge Plan: [place] Barriers to Discharge: Continued Medical Work up   Patient Goals and CMS Choice            Expected Discharge Plan and Services       Living arrangements for the past 2 months: [place]                                      Prior Living Arrangements/Services Living arrangements for the past 2 months: [place] Lives with:: Spouse Patient language and need for interpreter reviewed:: Yes        Need for Family Participation in Patient Care: Yes (Comment) Care giver support system in place?: Yes (comment)   Criminal Activity/Legal Involvement Pertinent to Current Situation/Hospitalization: No - Comment as needed  Activities of Daily Living Home Assistive Devices/Equipment: Grab bars around toilet, Dentures (specify type), Eyeglasses, Grab bars in shower, Shower chair with back, Walker (specify type) ADL Screening (condition at time of admission) Patient's cognitive ability adequate to safely complete daily activities?: No Is the patient deaf or have difficulty hearing?: No Does the patient have difficulty seeing, even when wearing glasses/contacts?: No Does the patient have difficulty concentrating, remembering, or making decisions?: Yes Patient able to express need for assistance with ADLs?: Yes Does the patient have difficulty dressing or bathing?: Yes Independently performs ADLs?: No Communication:  Needs assistance Is this a change from baseline?: Pre-admission baseline Dressing (OT): Needs assistance Is this a change from baseline?: Pre-admission baseline Grooming: Needs assistance Is this a change from baseline?: Pre-admission baseline Feeding: Independent Bathing: Needs assistance Is this a change from baseline?: Pre-admission baseline Toileting: Needs assistance Is this a change from baseline?: Pre-admission baseline In/Out Bed: Independent, Needs assistance Is this a change from baseline?: Change from baseline, expected to last <3 days Walks in Home: Independent with device (comment) Does the patient have difficulty walking or climbing stairs?: Yes Weakness of Legs: Both Weakness of Arms/Hands: Both  Permission Sought/Granted                  Emotional Assessment              Admission diagnosis:  TIA (transient ischemic attack) [G45.9] Generalized weakness [R53.1] COVID-19 virus infection [U07.1] Patient Active Problem List   Diagnosis Date Noted   Generalized weakness 07/01/2022   COVID-19 virus infection 07/01/2022   DNR (do not resuscitate)/DNI(Do Not Intubate) 07/01/2022   Dehydration 07/01/2022   Dementia, vascular, mixed, with behavioral disturbance ([place]) 03/01/2022   Hyperlipidemia 12/22/2012   Vitamin D deficiency 12/22/2012   Hematuria 12/21/2012   White coat syndrome without diagnosis of hypertension 12/19/2012   Allergic rhinitis 08/04/2011   Seborrheic dermatitis 07/14/2010   PCP:  Ginger Organ., MD Pharmacy:   [phone] [place],  [place] - [place] [address] Phone: [phone] Fax: [phone]  [place], [place] [address] Phone: [phone] Fax: [phone]     Social Determinants of Health (SDOH) Social History: SDOH Screenings   Food Insecurity: No Food Insecurity  (07/01/2022)  Housing: Low Risk  (07/01/2022)  Transportation Needs: No Transportation Needs (07/01/2022)  Utilities: Not At Risk (07/01/2022)  Depression (PHQ2-9): Low Risk  (05/15/2019)  Tobacco Use: Low Risk  (07/01/2022)   SDOH Interventions:     Readmission Risk Interventions     No data to display

## 2022-07-02 NOTE — Evaluation (Signed)
Physical Therapy Evaluation Patient Details Name: Sue Olsen MRN: [id number] DOB: [birth year] Today's Date: 07/02/2022  History of Present Illness  82 year old Caucasian female history of vascular dementia/Alzheimer type dementia, history of hyperlipidemia, prior history of stroke who presents to the ER today with inability to walk.  Pt tested positive for COVID once admitted.  Clinical Impression  Pt admitted with above diagnosis. Min A for supine to sit, min A to pivot to recliner. Pt saturated in urine, assisted with gown change and NT notified of need for linen change. Pt not oriented to location, not able to state her birthdate, but can follow simple commands. Per chart she's at an [facility], she will need a higher level of care. ST-SNF recommended.  Pt currently with functional limitations due to the deficits listed below (see PT Problem List). Pt will benefit from skilled PT to increase their independence and safety with mobility to allow discharge to the venue listed below.          Recommendations for follow up therapy are one component of a multi-disciplinary discharge planning process, led by the attending physician.  Recommendations may be updated based on patient status, additional functional criteria and insurance authorization.  Follow Up Recommendations Skilled nursing-short term rehab (<3 hours/day) Can patient physically be transported by private vehicle: Yes    Assistance Recommended at Discharge Intermittent Supervision/Assistance  Patient can return home with the following  A lot of help with walking and/or transfers;Assistance with cooking/housework;Assist for transportation    Equipment Recommendations Rolling walker (2 wheels)  Recommendations for Other Services       Functional Status Assessment Patient has had a recent decline in their functional status and demonstrates the ability to make significant improvements in function in a reasonable and predictable amount  of time.     Precautions / Restrictions Precautions Precautions: Fall Restrictions Weight Bearing Restrictions: No      Mobility  Bed Mobility         Supine to sit: Min assist          Transfers Overall transfer level: Needs assistance Equipment used: Rolling walker (2 wheels) Transfers: Sit to/from Stand, Bed to chair/wheelchair/BSC Sit to Stand: Min guard   Step pivot transfers: Min assist       General transfer comment: min/guard for safety, min A to guide hips to recliner as she attempted to sit prior to fully reaching recliner. Pt's bed saturated in urine, NT notified, assisted pt with gown change.    Ambulation/Gait                  Stairs            Wheelchair Mobility    Modified Rankin (Stroke Patients Only)       Balance Overall balance assessment: History of Falls, Needs assistance Sitting-balance support: Single extremity supported, Feet supported Sitting balance-Leahy Scale: Fair Sitting balance - Comments: Required Min As at times to correct sitting balance. Postural control: Posterior lean Standing balance support: Bilateral upper extremity supported, During functional activity Standing balance-Leahy Scale: Poor                               Pertinent Vitals/Pain Pain Assessment Breathing: normal Negative Vocalization: none Facial Expression: smiling or inexpressive Body Language: relaxed Consolability: no need to console PAINAD Score: 0 Facial Expression: Relaxed, neutral Body Movements: Absence of movements Muscle Tension: Relaxed    Home Living Family/patient expects to  be discharged to:: Private residence ([place] with spouse) Living Arrangements: Spouse/significant other   Type of Home: [place]: Rollator (4 wheels)      Prior Function Prior Level of Function : History of Falls (last six months) (Fall on 12/2021. Spouse could not  recall)  Cognitive Assist : ADLs (cognitive);Mobility (cognitive) Mobility (Cognitive): Step by step cues ADLs (Cognitive): Step by step cues Physical Assist : Mobility (physical);ADLs (physical) Mobility (physical): Gait ADLs (physical): IADLs;Toileting;Dressing;Bathing Mobility Comments: Per OT eval, Spouse reports pt began using a Rollator "a few weeks ago" but often forgets it. Prior to Rollator walked without AD. ADLs Comments: Per OT eval, Pt's spouse also with memory deficits and at times does not know how pt manages with things like hygiene after toileting. Pt's spouse does endorse a caregiver who comes every morning to help pt bathe 1-2 x/week and to get dressed daily. Pt's CG and spouse will help with "tidying up" and a housekeeper 1x/month for heavy tasks. Pt and spouse take their meals in the dining room and have laundry done.     Hand Dominance        Extremity/Trunk Assessment   Upper Extremity Assessment Upper Extremity Assessment: Defer to OT evaluation RUE Deficits / Details: Tremulous throughout session. Pt reports feeling cold. Warm blanket provided with pt still with UE/hand tremor. RUE Coordination: decreased fine motor LUE Deficits / Details: Tremulous throughout session. Pt reports feeling cold. Warm blanket provided with pt still with UE/hand tremor. LUE Coordination: decreased fine motor    Lower Extremity Assessment Lower Extremity Assessment: Overall WFL for tasks assessed    Cervical / Trunk Assessment Cervical / Trunk Assessment: Kyphotic  Communication   Communication: Receptive difficulties;Expressive difficulties  Cognition Arousal/Alertness: Awake/alert Behavior During Therapy: WFL for tasks assessed/performed Overall Cognitive Status: History of cognitive impairments - at baseline                                 General Comments: Advanced dementia. Not able to state birthday nor location, able to follow 1 step commands         General Comments      Exercises     Assessment/Plan    PT Assessment Patient needs continued PT services  PT Problem List Decreased activity tolerance;Decreased balance;Decreased mobility       PT Treatment Interventions Gait training;Therapeutic activities;Functional mobility training;Patient/family education;Therapeutic exercise    PT Goals (Current goals can be found in the Care Plan section)  Acute Rehab PT Goals PT Goal Formulation: Patient unable to participate in goal setting Time For Goal Achievement: 07/16/22 Potential to Achieve Goals: Fair    Frequency Min 2X/week     Co-evaluation               AM-PAC PT "6 Clicks" Mobility  Outcome Measure Help needed turning from your back to your side while in a flat bed without using bedrails?: None Help needed moving from lying on your back to sitting on the side of a flat bed without using bedrails?: A Little Help needed moving to and from a bed to a chair (including a wheelchair)?: A Little Help needed standing up from a chair using your arms (e.g., wheelchair or bedside chair)?: A Little Help needed to walk in hospital room?: A Lot Help needed climbing 3-5 steps with a railing? : A Lot  6 Click Score: 17    End of Session Equipment Utilized During Treatment: Gait belt Activity Tolerance: Patient tolerated treatment well Patient left: in chair;with chair alarm set;with call bell/phone within reach;with nursing/sitter in room Nurse Communication: Mobility status PT Visit Diagnosis: Difficulty in walking, not elsewhere classified (R26.2)    Time: [id number] PT Time Calculation (min) (ACUTE ONLY): 17 min   Charges:   PT Evaluation $PT Eval Moderate Complexity: 1 Mod          Philomena Doheny PT 07/02/2022  Acute Rehabilitation Services  Office [phone]

## 2022-07-02 NOTE — Progress Notes (Addendum)
PROGRESS NOTE    Sue Olsen  [id number] DOB: 1940-10-19 DOA: 07/01/2022 PCP: Ginger Organ., MD    Chief Complaint  Patient presents with   Weakness    Brief Narrative:  HPI per Dr. [name] 82 year old Caucasian female history of vascular dementia/Alzheimer type dementia, history of hyperlipidemia, prior history of stroke who presents to the ER today with inability to walk.  Family states that she has had worsening shuffling gait over the last several weeks but today was unable to walk even with a walker.  She has not had any upper respiratory symptoms.  She was mildly febrile on arrival with a temp of 100.2.  Daughter states that patient lives at [facility].  This is a independent living senior community.  She lives with her husband.  They have a caregiver that helps get the patient changed.  Daughter states that patient participated in all the community activities yesterday.  Patient did have a episode of incontinence of urine in her bed which is unusual for her.  This happened on Monday night/early Tuesday morning.   On arrival temp 98.3 heart rate 63 blood pressure 162/104 satting 99% on room air.   Labs:   UA negative for leukocyte esterase or nitrates.   COVID test was positive.  Influenza negative, RSV negative   Sodium 137, potassium 4.0, BUN of 20, creatinine 0.74   White count 7.4, hemoglobin 11.7, platelets of 117   Chest x-ray showed no acute cardiopulmonary disease   CT head was negative for acute intracranial abnormality.  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   [facility] hospital[facility] contacted for admission.    ED Course: CT head negative for acute CVA, Covid was POSITIVE      Assessment & Plan:  Principal Problem:   Generalized weakness Active Problems:   COVID-19 virus infection   Hyperlipidemia   Dementia, vascular, mixed, with behavioral disturbance (HCC)   DNR (do not resuscitate)/DNI(Do Not Intubate)   Dehydration    Assessment and  Plan: * Generalized weakness -Patient admitted with concerns of TIA versus CVA due to significant generalized lower extremity weakness.   -Head CT done with no acute intracranial abnormality.  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  -MRI brain done negative for acute abnormalities, brain atrophy and remote left frontal infarct.   -Carotid Dopplers pending.   -2D echo pending.   -Fasting lipid panel with cholesterol of 130, HDL of 61, LDL of 55.   -Initially due to concern for acute CVA permissive hypertension was allowed however MRI negative and as such we will place on low-dose Norvasc for better blood pressure control.   -Case discussed with neurology who does not feel anything from their standpoint at this time.   -Placed on IV fluids, PT/OT/ST.   -Appears dehydrated this AM with dry membranes, poor skin turgor. Staff reports only 25% breakfast this AM -Will give 250cc bolus   COVID-19 virus infection Incidental.  -Patient without any significant respiratory symptoms on admission however during my assessment patient with an occasional cough. -Patient with extremely dry mucous membranes. -Cycle threshold noted at 29.2. -CRP done at less than 0.5. -Due to patient's age, underlying dementia, significant dehydration will place patient on Paxlovid as patient has an increased risk for deterioration. -Place on vitamin C and zinc. -On minimal O2 support  Dementia, vascular, mixed, with behavioral disturbance (HCC) Chronic.  Continue with depakote 250 mg bid, zoloft 100 mg daily, aricept 10 mg  daily, namenda 10 mg bid.  Hyperlipidemia -Fasting lipid panel with LDL of 55, total cholesterol 130.   -Discontinue Crestor for now as patient has been started on Paxlovid and patient also presented with generalized weakness.  Dehydration -Patient remains clinically dehydrated on examination. -Currently on gentle IVF -Will give 250cc bolus x 1  DNR (do  not resuscitate)/DNI(Do Not Intubate) Admitting physician verified DNR with pt's dtr beverly.  Hypertension -BP remains poorly controlled -Increased norvasc from 2.5mg  to 5mg  -Continue PRN hydralazine that was started overnight   DVT prophylaxis: Heparin Code Status: DNR Family Communication: Updated husband at bedside.   Disposition: TBD  Status is: Inpatient Remains inpatient appropriate because: Severity of illness   Consultants:  None  Procedures:  Chest x-ray 07/01/2022   Antimicrobials:  Paxlovid 07/01/2022>>>>   Subjective: Not very conversant this AM.   Objective: Vitals:   07/02/22 0800 07/02/22 0926 07/02/22 1008 07/02/22 1449  BP:   (!) 177/95 (!) 169/81  Pulse:   66 (!) 55  Resp:   19 19  Temp:   98.1 F (36.7 C) 98.1 F (36.7 C)  TempSrc:      SpO2:   94% 98%  Weight: 45.1 kg 45.1 kg    Height:  5\' 4"  (1.626 m)      Intake/Output Summary (Last 24 hours) at 07/02/2022 1543 Last data filed at 07/02/2022 0900 Gross per 24 hour  Intake 1623.23 ml  Output 950 ml  Net 673.23 ml   Filed Weights   07/02/22 0800 07/02/22 0926  Weight: 45.1 kg 45.1 kg    Examination: General exam: Awake, laying in bed, in nad, mucus membranes dry Respiratory system: Normal respiratory effort, no wheezing Cardiovascular system: regular rate, s1, s2 Gastrointestinal system: Soft, nondistended, positive BS Central nervous system: CN2-12 grossly intact, strength intact Extremities: Perfused, no clubbing Skin: poor skin turgor, no notable skin lesions seen Psychiatry: Difficult to assess given acute illness and dehydration  Data Reviewed:   CBC: Recent Labs  Lab 07/01/22 0057 07/02/22 0625  WBC 7.4 5.5  NEUTROABS 4.8 2.0  HGB 11.7* 11.3*  HCT 36.1 35.2*  MCV 94.0 94.9  PLT 117* 104*     Basic Metabolic Panel: Recent Labs  Lab 07/01/22 0057 07/02/22 0625  NA 137 138  K 4.0 3.9  CL 103 101  CO2 25 26  GLUCOSE 99 90  BUN 20 12  CREATININE 0.74 0.65   CALCIUM 8.8* 8.5*  MG  --  2.3     GFR: Estimated Creatinine Clearance: 38.6 mL/min (by C-G formula based on SCr of 0.65 mg/dL).  Liver Function Tests: No results for input(s): "AST", "ALT", "ALKPHOS", "BILITOT", "PROT", "ALBUMIN" in the last 168 hours.  CBG: No results for input(s): "GLUCAP" in the last 168 hours.   Recent Results (from the past 240 hour(s))  Resp panel by RT-PCR (RSV, Flu A&B, Covid) Anterior Nasal Swab     Status: Abnormal   Collection Time: 07/01/22 12:57 AM   Specimen: Anterior Nasal Swab  Result Value Ref Range Status   SARS Coronavirus 2 by RT PCR POSITIVE (A) NEGATIVE Final    Comment: (NOTE) SARS-CoV-2 target nucleic acids are DETECTED.  The SARS-CoV-2 RNA is generally detectable in upper respiratory specimens during the acute phase of infection. Positive results are indicative of the presence of the identified virus, but do not rule out bacterial infection or co-infection with other pathogens not detected by the test. Clinical correlation with patient history and other diagnostic information is necessary  to determine patient infection status. The expected result is Negative.  Fact Sheet for Patients: EntrepreneurPulse.com.au  Fact Sheet for Healthcare Providers: IncredibleEmployment.be  This test is not yet approved or cleared by the [place] FDA and  has been authorized for detection and/or diagnosis of SARS-CoV-2 by FDA under an Emergency Use Authorization (EUA).  This EUA will remain in effect (meaning this test can be used) for the duration of  the COVID-19 declaration under Section 564(b)(1) of the A ct, 21 U.S.C. section 360bbb-3(b)(1), unless the authorization is terminated or revoked sooner.     Influenza A by PCR NEGATIVE NEGATIVE Final   Influenza B by PCR NEGATIVE NEGATIVE Final    Comment: (NOTE) The Xpert Xpress SARS-CoV-2/FLU/RSV plus assay is intended as an aid in the diagnosis of  influenza from Nasopharyngeal swab specimens and should not be used as a sole basis for treatment. Nasal washings and aspirates are unacceptable for Xpert Xpress SARS-CoV-2/FLU/RSV testing.  Fact Sheet for Patients: EntrepreneurPulse.com.au  Fact Sheet for Healthcare Providers: IncredibleEmployment.be  This test is not yet approved or cleared by the [place] FDA and has been authorized for detection and/or diagnosis of SARS-CoV-2 by FDA under an Emergency Use Authorization (EUA). This EUA will remain in effect (meaning this test can be used) for the duration of the COVID-19 declaration under Section 564(b)(1) of the Act, 21 U.S.C. section 360bbb-3(b)(1), unless the authorization is terminated or revoked.     Resp Syncytial Virus by PCR NEGATIVE NEGATIVE Final    Comment: (NOTE) Fact Sheet for Patients: EntrepreneurPulse.com.au  Fact Sheet for Healthcare Providers: IncredibleEmployment.be  This test is not yet approved or cleared by the [place] FDA and has been authorized for detection and/or diagnosis of SARS-CoV-2 by FDA under an Emergency Use Authorization (EUA). This EUA will remain in effect (meaning this test can be used) for the duration of the COVID-19 declaration under Section 564(b)(1) of the Act, 21 U.S.C. section 360bbb-3(b)(1), unless the authorization is terminated or revoked.  Performed at [facility], [place] [address] [place], [place] [address]          Radiology Studies: ECHOCARDIOGRAM COMPLETE  Result Date: 07/02/2022    ECHOCARDIOGRAM REPORT   Patient Name:   CADANCE SCHUMAN Date of Exam: 07/02/2022 Medical Rec #:  [id number]          Height:       61.0 in Accession #:    [id number]         Weight:       112.4 lb Date of Birth:  1940/07/11          BSA:          1.478 m Patient Age:    52 years           BP:           186/72 mmHg Patient Gender: F                   HR:           53 bpm. Exam Location:  Inpatient Procedure: 2D Echo, Cardiac Doppler and Color Doppler Indications:    TIA  History:        Patient has no prior history of Echocardiogram examinations.                 Risk Factors:Dyslipidemia.  Sonographer:    Phineas Douglas Referring Phys: [phone] [place]  1. Left ventricular ejection fraction, by estimation,  is 55 to 60%. The left ventricle has normal function. The left ventricle has no regional wall motion abnormalities. Left ventricular diastolic parameters were normal.  2. Right ventricular systolic function is normal. The right ventricular size is normal. There is normal pulmonary artery systolic pressure. The estimated right ventricular systolic pressure is 99991111 mmHg.  3. The mitral valve is grossly normal. Trivial mitral valve regurgitation. No evidence of mitral stenosis.  4. The aortic valve is grossly normal. There is mild calcification of the aortic valve. Aortic valve regurgitation is trivial. No aortic stenosis is present.  5. The inferior vena cava is normal in size with greater than 50% respiratory variability, suggesting right atrial pressure of 3 mmHg. Comparison(s): No prior Echocardiogram. Conclusion(s)/Recommendation(s): Normal biventricular function without evidence of hemodynamically significant valvular heart disease. FINDINGS  Left Ventricle: Left ventricular ejection fraction, by estimation, is 55 to 60%. The left ventricle has normal function. The left ventricle has no regional wall motion abnormalities. The left ventricular internal cavity size was normal in size. There is  borderline left ventricular hypertrophy. Left ventricular diastolic parameters were normal. Right Ventricle: The right ventricular size is normal. No increase in right ventricular wall thickness. Right ventricular systolic function is normal. There is normal pulmonary artery systolic pressure. The tricuspid regurgitant velocity is 2.36 m/s, and   with an assumed right atrial pressure of 3 mmHg, the estimated right ventricular systolic pressure is 99991111 mmHg. Left Atrium: Left atrial size was normal in size. Right Atrium: Right atrial size was normal in size. Pericardium: There is no evidence of pericardial effusion. Mitral Valve: The mitral valve is grossly normal. Trivial mitral valve regurgitation. No evidence of mitral valve stenosis. Tricuspid Valve: The tricuspid valve is grossly normal. Tricuspid valve regurgitation is trivial. No evidence of tricuspid stenosis. Aortic Valve: The aortic valve is grossly normal. There is mild calcification of the aortic valve. Aortic valve regurgitation is trivial. No aortic stenosis is present. Pulmonic Valve: The pulmonic valve was not well visualized. Pulmonic valve regurgitation is not visualized. No evidence of pulmonic stenosis. Aorta: The aortic root, ascending aorta, aortic arch and descending aorta are all structurally normal, with no evidence of dilitation or obstruction. Venous: The inferior vena cava is normal in size with greater than 50% respiratory variability, suggesting right atrial pressure of 3 mmHg. IAS/Shunts: The atrial septum is grossly normal.  LEFT VENTRICLE PLAX 2D LVIDd:         4.00 cm     Diastology LVIDs:         2.90 cm     LV e' medial:    5.87 cm/s LV PW:         1.10 cm     LV E/e' medial:  13.8 LV IVS:        1.10 cm     LV e' lateral:   7.40 cm/s LVOT diam:     1.80 cm     LV E/e' lateral: 10.9 LV SV:         57 LV SV Index:   39 LVOT Area:     2.54 cm  LV Volumes (MOD) LV vol d, MOD A2C: 56.7 ml LV vol d, MOD A4C: 70.5 ml LV vol s, MOD A2C: 23.5 ml LV vol s, MOD A4C: 28.3 ml LV SV MOD A2C:     33.2 ml LV SV MOD A4C:     70.5 ml LV SV MOD BP:      37.7 ml RIGHT VENTRICLE  IVC RV Basal diam:  3.30 cm     IVC diam: 1.50 cm RV S prime:     10.80 cm/s TAPSE (M-mode): 1.8 cm LEFT ATRIUM             Index        RIGHT ATRIUM          Index LA diam:        2.90 cm 1.96 cm/m   RA  Area:     9.76 cm LA Vol (A2C):   33.0 ml 22.32 ml/m  RA Volume:   19.00 ml 12.85 ml/m LA Vol (A4C):   40.4 ml 27.33 ml/m LA Biplane Vol: 37.8 ml 25.57 ml/m  AORTIC VALVE LVOT Vmax:   94.20 cm/s LVOT Vmean:  60.200 cm/s LVOT VTI:    0.224 m  AORTA Ao Root diam: 2.60 cm Ao Asc diam:  2.60 cm MITRAL VALVE               TRICUSPID VALVE MV Area (PHT): 2.83 cm    TR Peak grad:   22.3 mmHg MV Decel Time: 268 msec    TR Vmax:        236.00 cm/s MV E velocity: 81.00 cm/s MV A velocity: 97.30 cm/s  SHUNTS MV E/A ratio:  0.83        Systemic VTI:  0.22 m                            Systemic Diam: 1.80 cm Buford Dresser MD Electronically signed by Buford Dresser MD Signature Date/Time: 07/02/2022/1:18:09 PM    Final    VAS US CAROTID (at [facility] and [facility] only)  Result Date: 07/01/2022 Carotid Arterial Duplex Study Patient Name:  KATIEMARIE HAILU [facility]  Date of Exam:   07/01/2022 Medical Rec #: [id number]           Accession #:    [id number] Date of Birth: [birth year]           Patient Gender: F Patient Age:   4 years Exam Location:  [facility] Procedure:      VAS US CAROTID Referring Phys: ERIC CHEN --------------------------------------------------------------------------------  Indications:       TIA. Risk Factors:      Hyperlipidemia, prior CVA. Other Factors:     Vascular dementia, Covid-19. Comparison Study:  No prior studies. Performing Technologist: Darlin Coco RDMS, RVT  Examination Guidelines: A complete evaluation includes B-mode imaging, spectral Doppler, color Doppler, and power Doppler as needed of all accessible portions of each vessel. Bilateral testing is considered an integral part of a complete examination. Limited examinations for reoccurring indications may be performed as noted.  Right Carotid Findings: +----------+--------+--------+--------+------------------+--------+           PSV cm/sEDV cm/sStenosisPlaque DescriptionComments  +----------+--------+--------+--------+------------------+--------+ CCA Prox  86      9                                          +----------+--------+--------+--------+------------------+--------+ CCA Distal75      9                                          +----------+--------+--------+--------+------------------+--------+ ICA Prox  78      9  heterogenous mild          +----------+--------+--------+--------+------------------+--------+ ICA Mid   74      11                                         +----------+--------+--------+--------+------------------+--------+ ICA Distal73      15                                         +----------+--------+--------+--------+------------------+--------+ ECA       63                                                 +----------+--------+--------+--------+------------------+--------+ +----------+--------+-------+----------------+-------------------+           PSV cm/sEDV cmsDescribe        Arm Pressure (mmHG) +----------+--------+-------+----------------+-------------------+ [id number]            Multiphasic, WNL                    +----------+--------+-------+----------------+-------------------+ +---------+--------+--+--------+--+---------+ VertebralPSV cm/s86EDV cm/s14Antegrade +---------+--------+--+--------+--+---------+  Left Carotid Findings: +----------+--------+--------+--------+------------------+------------------+           PSV cm/sEDV cm/sStenosisPlaque DescriptionComments           +----------+--------+--------+--------+------------------+------------------+ CCA Prox  95      9                                                    +----------+--------+--------+--------+------------------+------------------+ CCA Distal71      9                                                    +----------+--------+--------+--------+------------------+------------------+ ICA Prox  90       14                                intimal thickening +----------+--------+--------+--------+------------------+------------------+ ICA Mid   70      11                                                   +----------+--------+--------+--------+------------------+------------------+ ICA Distal79      8                                                    +----------+--------+--------+--------+------------------+------------------+ ECA       114     7               heterogenous mild                    +----------+--------+--------+--------+------------------+------------------+ +----------+--------+--------+----------------+-------------------+  PSV cm/sEDV cm/sDescribe        Arm Pressure (mmHG) +----------+--------+--------+----------------+-------------------+ [id number]             Multiphasic, WNL                    +----------+--------+--------+----------------+-------------------+ +---------+--------+--+--------+--+---------+ VertebralPSV cm/s71EDV cm/s10Antegrade +---------+--------+--+--------+--+---------+   Summary: Right Carotid: The extracranial vessels were near-normal with only minimal wall                thickening or plaque. Left Carotid: The extracranial vessels were near-normal with only minimal wall               thickening or plaque. Vertebrals:  Bilateral vertebral arteries demonstrate antegrade flow. Subclavians: Normal flow hemodynamics were seen in bilateral subclavian              arteries. *See table(s) above for measurements and observations.     Preliminary    MR BRAIN WO CONTRAST  Result Date: 07/01/2022 CLINICAL DATA:  TIA.  Weakness with walking. EXAM: MRI HEAD WITHOUT CONTRAST TECHNIQUE: Multiplanar, multiecho pulse sequences of the brain and surrounding structures were obtained without intravenous contrast. COMPARISON:  Head CT from earlier today FINDINGS: Brain: No acute infarction, hemorrhage, hydrocephalus, extra-axial  collection or mass lesion. Chronic left anterior frontal infarct affecting cortex. Mild chronic small vessel ischemia in the cerebral white matter. Cerebral volume loss especially affecting the anterior temporal lobes. Vascular: Normal flow voids. Skull and upper cervical spine: Normal marrow signal. Sinuses/Orbits: Negative. IMPRESSION: 1. No acute or reversible finding. 2. Brain atrophy and remote left frontal infarct. Electronically Signed   By: Jorje Guild M.D.   On: 07/01/2022 06:39   CT Head Wo Contrast  Result Date: 07/01/2022 CLINICAL DATA:  Delirium EXAM: CT HEAD WITHOUT CONTRAST TECHNIQUE: Contiguous axial images were obtained from the base of the skull through the vertex without intravenous contrast. RADIATION DOSE REDUCTION: This exam was performed according to the departmental dose-optimization program which includes automated exposure control, adjustment of the mA and/or kV according to patient size and/or use of iterative reconstruction technique. COMPARISON:  01/16/2022 FINDINGS: Brain: There is no mass, hemorrhage or extra-axial collection. There is generalized atrophy without lobar predilection. Hypodensity of the white matter is most commonly associated with chronic microvascular disease. Old inferior left frontal infarct. Vascular: No abnormal hyperdensity of the major intracranial arteries or dural venous sinuses. No intracranial atherosclerosis. Skull: The visualized skull base, calvarium and extracranial soft tissues are normal. Sinuses/Orbits: No fluid levels or advanced mucosal thickening of the visualized paranasal sinuses. No mastoid or middle ear effusion. The orbits are normal. IMPRESSION: 1. No acute intracranial abnormality. 2. Old inferior left frontal infarct and findings of chronic microvascular disease. Electronically Signed   By: Ulyses Jarred M.D.   On: 07/01/2022 01:45   DG Chest Port 1 View  Result Date: 07/01/2022 CLINICAL DATA:  Incontinence with weakness and  abdominal pain. EXAM: PORTABLE CHEST 1 VIEW COMPARISON:  March 17, 2016 FINDINGS: The heart size and mediastinal contours are within normal limits. Moderate to marked severity calcification of the thoracic aorta is noted. Mild, chronic appearing increased lung markings are seen without evidence of acute infiltrate, pleural effusion or pneumothorax. Multilevel degenerative changes seen throughout the thoracic spine. IMPRESSION: No active disease. Electronically Signed   By: Virgina Norfolk M.D.   On: 07/01/2022 01:06        Scheduled Meds:  [START ON 07/03/2022] amLODipine  5 mg Oral Daily   aspirin EC  81 mg  Oral Daily   divalproex  250 mg Oral BID   donepezil  10 mg Oral Daily   heparin  5,000 Units Subcutaneous Q8H   memantine  10 mg Oral BID   nirmatrelvir/ritonavir  3 tablet Oral BID   sertraline  100 mg Oral Daily   Continuous Infusions:  sodium chloride 100 mL/hr at 07/02/22 0750     LOS: 1 day   Marylu Lund, MD [facility]   To contact the attending provider between 7A-7P or the covering provider during after hours 7P-7A, please log into the web site [email or web address] and access using universal [place] password for that web site. If you do not have the password, please call the hospital operator.  07/02/2022, 3:43 PM

## 2022-07-02 NOTE — Progress Notes (Signed)
Returned call to patients family - Rise Paganini. Provided updated on status of patient.

## 2022-07-02 NOTE — Plan of Care (Signed)
  Problem: Education: Goal: Knowledge of disease or condition will improve Outcome: Not Progressing   

## 2022-07-02 NOTE — Evaluation (Signed)
Occupational Therapy Evaluation Patient Details Name: Sue Olsen MRN: [id number] DOB: 30-Sep-1940 Today's Date: 07/02/2022   History of Present Illness 82 year old Caucasian female history of vascular dementia/Alzheimer type dementia, history of hyperlipidemia, prior history of stroke who presents to the ER today with inability to walk.  Pt tested positive for COVID once admitted.   Clinical Impression   Patient is currently requiring assistance with ADLs including up to total assist with Lower body ADLs, up to modeate assist with Upper body ADLs including feeding,  as well as  Minimal assist with bed mobility and Min assist with functional transfers to [facility].  Current level of function may be below patient's typical baseline, however both pt and spouse are unreliable historians, but spouse able to report that pt has a daily caregiver in the morning who helps with bathing and dressing and that pt recently began using a Rolator but often forgets it.    During this evaluation, patient was limited by generalized weakness, impaired activity tolerance, and baseline advanced cognitive deficits, and tremors, all of which has the potential to impact patient's safety and independence during functional mobility, as well as performance for ADLs.  Patient lives with her spouse,  who is unable to provide reliable supervision and assistance due to his own cognitive deficits.  Patient demonstrates fair rehab potential, and should benefit from continued skilled occupational therapy services while in acute care to maximize safety, independence and quality of life at home.  Continued occupational therapy services in-house at [facility] to keep pt in her familiar environment is recommended is possible.  ?     Recommendations for follow up therapy are one component of a multi-disciplinary discharge planning process, led by the attending physician.  Recommendations may be updated based on patient status,  additional functional criteria and insurance authorization.   Follow Up Recommendations   (Recommend in-house PT and OT at [facility] with 24/7 supervision)     Assistance Recommended at Discharge Frequent or constant Supervision/Assistance  Patient can return home with the following A little help with walking and/or transfers;Direct supervision/assist for medications management;A lot of help with bathing/dressing/bathroom;Direct supervision/assist for financial management;Assist for transportation;Assistance with cooking/housework;Assistance with feeding    Functional Status Assessment  Patient has had a recent decline in their functional status and demonstrates the ability to make significant improvements in function in a reasonable and predictable amount of time.  Equipment Recommendations  None recommended by OT    Recommendations for Other Services PT consult     Precautions / Restrictions Precautions Precautions: Fall Restrictions Weight Bearing Restrictions: No      Mobility Bed Mobility Overal bed mobility: Needs Assistance Bed Mobility: Supine to Sit, Sit to Supine     Supine to sit: Min assist, Total assist Sit to supine: Min assist (to bring LEs onto bed.)   General bed mobility comments: Pt able to move from supine to long sitting without assistance. Neede assistance to bring LEs off EOB and required Total Assistance to scoot anteriorly to EOB as pt was stuck in middle.    Transfers                          Balance Overall balance assessment: History of Falls, Needs assistance Sitting-balance support: Single extremity supported, Feet supported Sitting balance-Leahy Scale: Poor Sitting balance - Comments: Required Min As at times to correct sitting balance. Postural control: Posterior lean Standing balance support: Bilateral upper extremity supported, During functional activity  Standing balance-Leahy Scale: Poor                              ADL either performed or assessed with clinical judgement   ADL Overall ADL's : Needs assistance/impaired Eating/Feeding: Moderate assistance;Sitting;Cueing for sequencing Eating/Feeding Details (indicate cue type and reason): Pt refused lunch while OT present. Grooming: Wash/dry hands;Minimal assistance;Cueing for sequencing;Sitting   Upper Body Bathing: Moderate assistance;Cueing for sequencing;Sitting   Lower Body Bathing: Maximal assistance;Bed level;Cueing for sequencing Lower Body Bathing Details (indicate cue type and reason): See toileting below. Upper Body Dressing : Moderate assistance;Sitting;Cueing for sequencing   Lower Body Dressing: Total assistance;Sitting/lateral leans;Cueing for sequencing   Toilet Transfer: Stand-pivot;BSC/3in1;Rolling walker (2 wheels);Cueing for sequencing Toilet Transfer Details (indicate cue type and reason): Pt stood from EOB with Min As to RW. Pt pivted to [facility] with Min As. Pt stood from [facility] x 3 for hygiene/toileting needs with light Min Assist, puhsing from rails on BSC to RW. Pt pivoted back to EOB with Min Guard assist with RW. Very tremulous throughout. Toileting- Clothing Manipulation and Hygiene: Total assistance;Sit to/from stand;Bed level;Cueing for sequencing Toileting - Clothing Manipulation Details (indicate cue type and reason): PT found to be heavily soiled in bed. Pt required frequent cues to kep her hands out of soiled areas. Pt able to clean her peri area anteriorly at bed level with setup/cues and Moderate assist. Otherwise, pt required Total Assist standing from EOB to RW and again after pt continued voiding in [facility] with Total As.     Functional mobility during ADLs: Minimal assistance;Cueing for sequencing;Cueing for safety;Rolling walker (2 wheels)       Vision   Additional Comments: Downwards gaze indicative of advanced dementia     Perception Perception Comments: Decreased depth perception   Praxis       Pertinent Vitals/Pain Pain Assessment Pain Assessment: PAINAD Breathing: normal Negative Vocalization: none Facial Expression: smiling or inexpressive Body Language: tense, distressed pacing, fidgeting Consolability: no need to console PAINAD Score: 1     Hand Dominance     Extremity/Trunk Assessment Upper Extremity Assessment Upper Extremity Assessment: Generalized weakness;RUE deficits/detail;LUE deficits/detail RUE Deficits / Details: Tremulous throughout session. Pt reports feeling cold. Warm blanket provided with pt still with UE/hand tremor. RUE Coordination: decreased fine motor LUE Deficits / Details: Tremulous throughout session. Pt reports feeling cold. Warm blanket provided with pt still with UE/hand tremor. LUE Coordination: decreased fine motor       Cervical / Trunk Assessment Cervical / Trunk Assessment: Kyphotic   Communication Communication Communication: Receptive difficulties;Expressive difficulties   Cognition Arousal/Alertness: Awake/alert Behavior During Therapy: WFL for tasks assessed/performed Overall Cognitive Status: History of cognitive impairments - at baseline                                 General Comments: Advanced dementia. Would classify on GEMs dementia scale as Amber to Ruby or Global deterioration scale of 6.     General Comments       Exercises     Shoulder Instructions      Home Living Family/patient expects to be discharged to:: Assisted living ([place] with spouse)                             Home Equipment: Rollator (4 wheels)   Additional Comments: (P) Husband  giving history but also with memory deficits. Pt does not recognize husband today.      Prior Functioning/Environment Prior Level of Function : History of Falls (last six months) (Fall on 12/2021. Spouse could not recall)  Cognitive Assist : ADLs (cognitive);Mobility (cognitive) Mobility (Cognitive): Step by step cues ADLs  (Cognitive): Step by step cues Physical Assist : Mobility (physical);ADLs (physical) Mobility (physical): Gait ADLs (physical): IADLs;Toileting;Dressing;Bathing Mobility Comments: Spouse reports pt began using a Rollator "a few weeks ago" but often forgets it. Prior to Rollator walked without AD. ADLs Comments: Pt's spouse also with memory deficits and at times does not know how pt manages with things like hygiene after toileting. Pt's spouse does endorse a caregiver who comes every morning to help pt bathe 1-2 x/week and to get dressed daily. Pt's CG and spouse will help with "tidying up" and a housekeeper 1x/month for heavy tasks. Pt and spouse take their meals in the dining room and have laundry done.        OT Problem List: Decreased cognition;Decreased coordination;Cardiopulmonary status limiting activity;Decreased strength;Decreased range of motion;Decreased activity tolerance;Decreased safety awareness;Decreased knowledge of use of DME or AE;Impaired balance (sitting and/or standing);Impaired UE functional use;Decreased knowledge of precautions      OT Treatment/Interventions: Self-care/ADL training;Therapeutic exercise;Therapeutic activities;Patient/family education;DME and/or AE instruction;Balance training    OT Goals(Current goals can be found in the care plan section) Acute Rehab OT Goals Patient Stated Goal: Spouse agreeable to have in-house post-acute therapies if [name] can provide this. OT Goal Formulation: With family Time For Goal Achievement: 07/16/22 Potential to Achieve Goals: Fair ADL Goals Pt Will Perform Grooming: sitting;with min guard assist;with set-up Pt Will Perform Upper Body Bathing: with min guard assist;with set-up;sitting Pt Will Transfer to Toilet: with supervision;ambulating Pt/caregiver will Perform Home Exercise Program: Increased ROM;Increased strength;Both right and left upper extremity;With minimal assist Additional ADL Goal #1: Pt will engage  in at least 5 min x 2 standing functional activities without loss of standing balance, in order to demonstrate improved activity tolerance and balance needed to perform ADLs safely at home.  OT Frequency: Min 2X/week    Co-evaluation              AM-PAC OT "6 Clicks" Daily Activity     Outcome Measure Help from another person eating meals?: A Lot Help from another person taking care of personal grooming?: A Little Help from another person toileting, which includes using toliet, bedpan, or urinal?: Total Help from another person bathing (including washing, rinsing, drying)?: A Lot Help from another person to put on and taking off regular upper body clothing?: A Lot Help from another person to put on and taking off regular lower body clothing?: Total 6 Click Score: 11   End of Session Equipment Utilized During Treatment: Gait belt;Rolling walker (2 wheels) Nurse Communication: Other (comment) (OT needed assistance for bed change)  Activity Tolerance: Patient tolerated treatment well Patient left: in bed;with call bell/phone within reach;with bed alarm set;with family/visitor present  OT Visit Diagnosis: Unsteadiness on feet (R26.81);Other symptoms and signs involving cognitive function;Cognitive communication deficit (R41.841);Feeding difficulties (R63.3);History of falling (Z91.81);Muscle weakness (generalized) (M62.81);Apraxia (R48.2) Symptoms and signs involving cognitive functions:  (dementia)                Time: [id number] OT Time Calculation (min): 53 min Charges:  OT General Charges $OT Visit: 1 Visit OT Evaluation $OT Eval Low Complexity: 1 Low OT Treatments $Self Care/Home Management : 23-37 mins $Therapeutic Activity: 8-22 mins  Anderson Malta, [place] Office: [phone] 07/02/2022  Julien Girt 07/02/2022, 11:39 AM

## 2022-07-02 NOTE — Progress Notes (Signed)
Given pt's brain imaging is negative and she has dementia - resides at [facility] with her spouse, SLE not indicated at this time.    Kathleen Lime, MS [place] Office [phone]

## 2022-07-03 DIAGNOSIS — F01518 Vascular dementia, unspecified severity, with other behavioral disturbance: Secondary | ICD-10-CM | POA: Diagnosis not present

## 2022-07-03 DIAGNOSIS — E785 Hyperlipidemia, unspecified: Secondary | ICD-10-CM | POA: Diagnosis not present

## 2022-07-03 DIAGNOSIS — U071 COVID-19: Secondary | ICD-10-CM | POA: Diagnosis not present

## 2022-07-03 DIAGNOSIS — G459 Transient cerebral ischemic attack, unspecified: Secondary | ICD-10-CM | POA: Diagnosis not present

## 2022-07-03 LAB — CBC
HCT: 40.8 % (ref 36.0–46.0)
Hemoglobin: 13.5 g/dL (ref 12.0–15.0)
MCH: 30.2 pg (ref 26.0–34.0)
MCHC: 33.1 g/dL (ref 30.0–36.0)
MCV: 91.3 fL (ref 80.0–100.0)
Platelets: 126 10*3/uL — ABNORMAL LOW (ref 150–400)
RBC: 4.47 MIL/uL (ref 3.87–5.11)
RDW: 12.2 % (ref 11.5–15.5)
WBC: 6.6 10*3/uL (ref 4.0–10.5)
nRBC: 0 % (ref 0.0–0.2)

## 2022-07-03 LAB — COMPREHENSIVE METABOLIC PANEL
ALT: 37 U/L (ref 0–44)
AST: 41 U/L (ref 15–41)
Albumin: 3.8 g/dL (ref 3.5–5.0)
Alkaline Phosphatase: 62 U/L (ref 38–126)
Anion gap: 12 (ref 5–15)
BUN: 7 mg/dL — ABNORMAL LOW (ref 8–23)
CO2: 22 mmol/L (ref 22–32)
Calcium: 8.9 mg/dL (ref 8.9–10.3)
Chloride: 102 mmol/L (ref 98–111)
Creatinine, Ser: 0.37 mg/dL — ABNORMAL LOW (ref 0.44–1.00)
GFR, Estimated: >60 mL/min (ref >60)
Glucose, Bld: 90 mg/dL (ref 70–99)
Potassium: 3.3 mmol/L — ABNORMAL LOW (ref 3.5–5.1)
Sodium: 136 mmol/L (ref 135–145)
Total Bilirubin: 0.6 mg/dL (ref 0.3–1.2)
Total Protein: 7.3 g/dL (ref 6.5–8.1)

## 2022-07-03 MED ORDER — POTASSIUM CHLORIDE CRYS ER 10 MEQ PO TBCR
40.0000 meq | EXTENDED_RELEASE_TABLET | Freq: Once | ORAL | Status: AC
  Administered 2022-07-03: 40 meq via ORAL
  Filled 2022-07-03: qty 4

## 2022-07-03 MED ORDER — ENOXAPARIN SODIUM 30 MG/0.3ML IJ SOSY
30.0000 mg | PREFILLED_SYRINGE | INTRAMUSCULAR | Status: DC
  Administered 2022-07-03 – 2022-07-04 (×2): 30 mg via SUBCUTANEOUS
  Filled 2022-07-03 (×2): qty 0.3

## 2022-07-03 MED ORDER — SODIUM CHLORIDE 0.9 % IV SOLN: INTRAVENOUS | Status: AC

## 2022-07-03 MED ORDER — OLANZAPINE 5 MG PO TBDP
2.5000 mg | ORAL_TABLET | Freq: Once | ORAL | Status: AC
  Administered 2022-07-03: 2.5 mg via ORAL
  Filled 2022-07-03: qty 0.5

## 2022-07-03 MED ORDER — VITAMIN C 500 MG PO TABS
500.0000 mg | ORAL_TABLET | Freq: Every day | ORAL | Status: DC
  Administered 2022-07-03 – 2022-07-07 (×5): 500 mg via ORAL
  Filled 2022-07-03 (×5): qty 1

## 2022-07-03 MED ORDER — BENZONATATE 100 MG PO CAPS
100.0000 mg | ORAL_CAPSULE | Freq: Three times a day (TID) | ORAL | Status: DC
  Administered 2022-07-03 – 2022-07-07 (×13): 100 mg via ORAL
  Filled 2022-07-03 (×13): qty 1

## 2022-07-03 MED ORDER — ZINC SULFATE 220 (50 ZN) MG PO CAPS
220.0000 mg | ORAL_CAPSULE | Freq: Every day | ORAL | Status: DC
  Administered 2022-07-03 – 2022-07-07 (×5): 220 mg via ORAL
  Filled 2022-07-03 (×5): qty 1

## 2022-07-03 NOTE — Progress Notes (Addendum)
PROGRESS NOTE    Sue Olsen  [id number] DOB: 1940/06/28 DOA: 07/01/2022 PCP: Ginger Organ., MD    Chief Complaint  Patient presents with   Weakness    Brief Narrative:  HPI per Dr. [name] 82 year old Caucasian female history of vascular dementia/Alzheimer type dementia, history of hyperlipidemia, prior history of stroke who presents to the ER today with inability to walk.  Family states that she has had worsening shuffling gait over the last several weeks but today was unable to walk even with a walker.  She has not had any upper respiratory symptoms.  She was mildly febrile on arrival with a temp of 100.2.  Daughter states that patient lives at [facility].  This is a independent living senior community.  She lives with her husband.  They have a caregiver that helps get the patient changed.  Daughter states that patient participated in all the community activities yesterday.  Patient did have a episode of incontinence of urine in her bed which is unusual for her.  This happened on Monday night/early Tuesday morning.   On arrival temp 98.3 heart rate 63 blood pressure 162/104 satting 99% on room air.   Labs:   UA negative for leukocyte esterase or nitrates.   COVID test was positive.  Influenza negative, RSV negative   Sodium 137, potassium 4.0, BUN of 20, creatinine 0.74   White count 7.4, hemoglobin 11.7, platelets of 117   Chest x-ray showed no acute cardiopulmonary disease   CT head was negative for acute intracranial abnormality.  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   [facility] hospital[facility] contacted for admission.    ED Course: CT head negative for acute CVA, Covid was POSITIVE      Assessment & Plan:  Principal Problem:   Generalized weakness Active Problems:   COVID-19 virus infection   Hyperlipidemia   Dementia, vascular, mixed, with behavioral disturbance (HCC)   DNR (do not resuscitate)/DNI(Do Not Intubate)   Dehydration    Assessment and  Plan: * Generalized weakness -Patient admitted with concerns of TIA versus CVA due to significant generalized lower extremity weakness.   -Head CT done with no acute intracranial abnormality.  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  -MRI brain done negative for acute abnormalities, brain atrophy and remote left frontal infarct.   -Carotid Dopplers with no significant ICA stenosis. -2D echo with EF of 55 to 60%, NWMA, no significant valvular abnormalities.  Atrial septum was grossly normal.   -Fasting lipid panel with cholesterol of 130, HDL of 61, LDL of 55.   -Initially due to concern for acute CVA permissive hypertension was allowed however MRI negative and as such patient started on low-dose Norvasc and dose increased for better blood pressure control.  -Case discussed with neurology who does not feel anything from their standpoint at this time.   -Continue IV fluids, PT/OT/ST.   -Supportive care.   COVID-19 virus infection Incidental.  -Patient without any significant respiratory symptoms on admission however during my assessment patient with an occasional cough. -Patient with extremely dry mucous membranes. -Cycle threshold noted at 29.2. -CRP < 0.5. -Due to patient's age, underlying dementia, significant dehydration patient was started on Paxlovid as patient has an increased risk for deterioration. -Place on vitamin C and zinc. -Tessalon Perles 100 mg 3 times daily. -Supportive care.  Dementia, vascular, mixed, with behavioral disturbance ([place]) Chronic.  -Patient with some agitation per RN. Continue with depakote 250 mg bid,  zoloft 100 mg daily, aricept 10 mg daily, namenda 10 mg bid. -Given one-time dose of Zyprexa 2.5 mg p.o. x 1.  Hyperlipidemia -Fasting lipid panel with LDL of 55, total cholesterol 130.   -Held Crestor for now as patient has been started on Paxlovid and patient also presented with generalized weakness. -Could likely  resume statin on discharge.  Dehydration -Patient clinically dehydrated on examination. -Continue gentle hydration with IV fluids.  -Supportive care.  DNR (do not resuscitate)/DNI(Do Not Intubate) Admitting physician verified DNR with pt's dtr beverly.         DVT prophylaxis: Heparin Code Status: DNR Family Communication: Updated daughter and husband at bedside. Disposition: SNF  Status is: Inpatient Remains inpatient appropriate because: Severity of illness   Consultants:  Curb sided neurology:  Procedures:  Chest x-ray 07/01/2022 2D echo 07/02/2022 MRI brain 07/01/2022 Carotid Dopplers 07/01/2022   Antimicrobials:  Paxlovid 07/01/2022>>>> 07/06/2022   Subjective: Laying in bed with mittens on.  Denies any significant chest pain or shortness of breath.  Occasional dry cough.  Husband at bedside.  Daughter at bedside.  Per RN patient did not eat a meal for dinner last night but ate 100% of her breakfast this morning.   Objective: Vitals:   07/02/22 2033 07/03/22 0504 07/03/22 0712 07/03/22 1410  BP: (!) 162/75 (!) 162/92  (!) 134/23  Pulse: 64 64  64  Resp: 15 16  16   Temp: 98.1 F (36.7 C) 97.9 F (36.6 C)  98 F (36.7 C)  TempSrc: Oral Oral  Oral  SpO2: 98% 100%  100%  Weight:   47.2 kg   Height:        Intake/Output Summary (Last 24 hours) at 07/03/2022 1510 Last data filed at 07/03/2022 1422 Gross per 24 hour  Intake 752 ml  Output 2850 ml  Net -2098 ml   Filed Weights   07/02/22 0800 07/02/22 0926 07/03/22 0712  Weight: 45.1 kg 45.1 kg 47.2 kg    Examination:  General exam: Appears calm and comfortable.  Dry mucous membranes.  Mittens on.   Respiratory system: CTAB anterior lung fields.  No wheezes, no crackles, no rhonchi.  Fair air movement.  Speaking in full sentences.  Cardiovascular system: RRR no murmurs rubs or gallops.  No JVD.  No lower extremity edema.  Gastrointestinal system: Abdomen is soft, nontender, nondistended, positive bowel  sounds.  No rebound.  No guarding. Central nervous system: Alert. No focal neurological deficits. Extremities: Symmetric 5 x 5 power. Skin: No rashes, lesions or ulcers Psychiatry: Judgement and insight appear poor to fair. Mood & affect appropriate.     Data Reviewed:   CBC: Recent Labs  Lab 07/01/22 0057 07/02/22 0625 07/03/22 0544  WBC 7.4 5.5 6.6  NEUTROABS 4.8 2.0  --   HGB 11.7* 11.3* 13.5  HCT 36.1 35.2* 40.8  MCV 94.0 94.9 91.3  PLT 117* 104* 126*    Basic Metabolic Panel: Recent Labs  Lab 07/01/22 0057 07/02/22 0625 07/03/22 0544  NA 137 138 136  K 4.0 3.9 3.3*  CL 103 101 102  CO2 25 26 22   GLUCOSE 99 90 90  BUN 20 12 7*  CREATININE 0.74 0.65 0.37*  CALCIUM 8.8* 8.5* 8.9  MG  --  2.3  --     GFR: Estimated Creatinine Clearance: 40.4 mL/min (A) (by C-G formula based on SCr of 0.37 mg/dL (L)).  Liver Function Tests: Recent Labs  Lab 07/03/22 0544  AST 41  ALT 37  ALKPHOS 62  BILITOT 0.6  PROT 7.3  ALBUMIN 3.8    CBG: No results for input(s): "GLUCAP" in the last 168 hours.   Recent Results (from the past 240 hour(s))  Resp panel by RT-PCR (RSV, Flu A&B, Covid) Anterior Nasal Swab     Status: Abnormal   Collection Time: 07/01/22 12:57 AM   Specimen: Anterior Nasal Swab  Result Value Ref Range Status   SARS Coronavirus 2 by RT PCR POSITIVE (A) NEGATIVE Final    Comment: (NOTE) SARS-CoV-2 target nucleic acids are DETECTED.  The SARS-CoV-2 RNA is generally detectable in upper respiratory specimens during the acute phase of infection. Positive results are indicative of the presence of the identified virus, but do not rule out bacterial infection or co-infection with other pathogens not detected by the test. Clinical correlation with patient history and other diagnostic information is necessary to determine patient infection status. The expected result is Negative.  Fact Sheet for  Patients: EntrepreneurPulse.com.au  Fact Sheet for Healthcare Providers: IncredibleEmployment.be  This test is not yet approved or cleared by the [place] FDA and  has been authorized for detection and/or diagnosis of SARS-CoV-2 by FDA under an Emergency Use Authorization (EUA).  This EUA will remain in effect (meaning this test can be used) for the duration of  the COVID-19 declaration under Section 564(b)(1) of the A ct, 21 U.S.C. section 360bbb-3(b)(1), unless the authorization is terminated or revoked sooner.     Influenza A by PCR NEGATIVE NEGATIVE Final   Influenza B by PCR NEGATIVE NEGATIVE Final    Comment: (NOTE) The Xpert Xpress SARS-CoV-2/FLU/RSV plus assay is intended as an aid in the diagnosis of influenza from Nasopharyngeal swab specimens and should not be used as a sole basis for treatment. Nasal washings and aspirates are unacceptable for Xpert Xpress SARS-CoV-2/FLU/RSV testing.  Fact Sheet for Patients: EntrepreneurPulse.com.au  Fact Sheet for Healthcare Providers: IncredibleEmployment.be  This test is not yet approved or cleared by the [place] FDA and has been authorized for detection and/or diagnosis of SARS-CoV-2 by FDA under an Emergency Use Authorization (EUA). This EUA will remain in effect (meaning this test can be used) for the duration of the COVID-19 declaration under Section 564(b)(1) of the Act, 21 U.S.C. section 360bbb-3(b)(1), unless the authorization is terminated or revoked.     Resp Syncytial Virus by PCR NEGATIVE NEGATIVE Final    Comment: (NOTE) Fact Sheet for Patients: EntrepreneurPulse.com.au  Fact Sheet for Healthcare Providers: IncredibleEmployment.be  This test is not yet approved or cleared by the [place] FDA and has been authorized for detection and/or diagnosis of SARS-CoV-2 by FDA under an Emergency Use  Authorization (EUA). This EUA will remain in effect (meaning this test can be used) for the duration of the COVID-19 declaration under Section 564(b)(1) of the Act, 21 U.S.C. section 360bbb-3(b)(1), unless the authorization is terminated or revoked.  Performed at [facility], [place] [address], [place], [place] [address]          Radiology Studies: ECHOCARDIOGRAM COMPLETE  Result Date: 07/02/2022    ECHOCARDIOGRAM REPORT   Patient Name:   Sue Olsen Date of Exam: 07/02/2022 Medical Rec #:  [id number]          Height:       61.0 in Accession #:    [id number]         Weight:       112.4 lb Date of Birth:  May 17, 1940          BSA:  1.478 m Patient Age:    61 years           BP:           186/72 mmHg Patient Gender: F                  HR:           53 bpm. Exam Location:  Inpatient Procedure: 2D Echo, Cardiac Doppler and Color Doppler Indications:    TIA  History:        Patient has no prior history of Echocardiogram examinations.                 Risk Factors:Dyslipidemia.  Sonographer:    Phineas Douglas Referring Phys: [phone] [place]  1. Left ventricular ejection fraction, by estimation, is 55 to 60%. The left ventricle has normal function. The left ventricle has no regional wall motion abnormalities. Left ventricular diastolic parameters were normal.  2. Right ventricular systolic function is normal. The right ventricular size is normal. There is normal pulmonary artery systolic pressure. The estimated right ventricular systolic pressure is 99991111 mmHg.  3. The mitral valve is grossly normal. Trivial mitral valve regurgitation. No evidence of mitral stenosis.  4. The aortic valve is grossly normal. There is mild calcification of the aortic valve. Aortic valve regurgitation is trivial. No aortic stenosis is present.  5. The inferior vena cava is normal in size with greater than 50% respiratory variability, suggesting right atrial pressure of 3 mmHg.  Comparison(s): No prior Echocardiogram. Conclusion(s)/Recommendation(s): Normal biventricular function without evidence of hemodynamically significant valvular heart disease. FINDINGS  Left Ventricle: Left ventricular ejection fraction, by estimation, is 55 to 60%. The left ventricle has normal function. The left ventricle has no regional wall motion abnormalities. The left ventricular internal cavity size was normal in size. There is  borderline left ventricular hypertrophy. Left ventricular diastolic parameters were normal. Right Ventricle: The right ventricular size is normal. No increase in right ventricular wall thickness. Right ventricular systolic function is normal. There is normal pulmonary artery systolic pressure. The tricuspid regurgitant velocity is 2.36 m/s, and  with an assumed right atrial pressure of 3 mmHg, the estimated right ventricular systolic pressure is 99991111 mmHg. Left Atrium: Left atrial size was normal in size. Right Atrium: Right atrial size was normal in size. Pericardium: There is no evidence of pericardial effusion. Mitral Valve: The mitral valve is grossly normal. Trivial mitral valve regurgitation. No evidence of mitral valve stenosis. Tricuspid Valve: The tricuspid valve is grossly normal. Tricuspid valve regurgitation is trivial. No evidence of tricuspid stenosis. Aortic Valve: The aortic valve is grossly normal. There is mild calcification of the aortic valve. Aortic valve regurgitation is trivial. No aortic stenosis is present. Pulmonic Valve: The pulmonic valve was not well visualized. Pulmonic valve regurgitation is not visualized. No evidence of pulmonic stenosis. Aorta: The aortic root, ascending aorta, aortic arch and descending aorta are all structurally normal, with no evidence of dilitation or obstruction. Venous: The inferior vena cava is normal in size with greater than 50% respiratory variability, suggesting right atrial pressure of 3 mmHg. IAS/Shunts: The atrial septum  is grossly normal.  LEFT VENTRICLE PLAX 2D LVIDd:         4.00 cm     Diastology LVIDs:         2.90 cm     LV e' medial:    5.87 cm/s LV PW:         1.10 cm  LV E/e' medial:  13.8 LV IVS:        1.10 cm     LV e' lateral:   7.40 cm/s LVOT diam:     1.80 cm     LV E/e' lateral: 10.9 LV SV:         57 LV SV Index:   39 LVOT Area:     2.54 cm  LV Volumes (MOD) LV vol d, MOD A2C: 56.7 ml LV vol d, MOD A4C: 70.5 ml LV vol s, MOD A2C: 23.5 ml LV vol s, MOD A4C: 28.3 ml LV SV MOD A2C:     33.2 ml LV SV MOD A4C:     70.5 ml LV SV MOD BP:      37.7 ml RIGHT VENTRICLE             IVC RV Basal diam:  3.30 cm     IVC diam: 1.50 cm RV S prime:     10.80 cm/s TAPSE (M-mode): 1.8 cm LEFT ATRIUM             Index        RIGHT ATRIUM          Index LA diam:        2.90 cm 1.96 cm/m   RA Area:     9.76 cm LA Vol (A2C):   33.0 ml 22.32 ml/m  RA Volume:   19.00 ml 12.85 ml/m LA Vol (A4C):   40.4 ml 27.33 ml/m LA Biplane Vol: 37.8 ml 25.57 ml/m  AORTIC VALVE LVOT Vmax:   94.20 cm/s LVOT Vmean:  60.200 cm/s LVOT VTI:    0.224 m  AORTA Ao Root diam: 2.60 cm Ao Asc diam:  2.60 cm MITRAL VALVE               TRICUSPID VALVE MV Area (PHT): 2.83 cm    TR Peak grad:   22.3 mmHg MV Decel Time: 268 msec    TR Vmax:        236.00 cm/s MV E velocity: 81.00 cm/s MV A velocity: 97.30 cm/s  SHUNTS MV E/A ratio:  0.83        Systemic VTI:  0.22 m                            Systemic Diam: 1.80 cm Buford Dresser MD Electronically signed by Buford Dresser MD Signature Date/Time: 07/02/2022/1:18:09 PM    Final    VAS US CAROTID (at [facility] and [facility] only)  Result Date: 07/01/2022 Carotid Arterial Duplex Study Patient Name:  VASHON ALLSBROOK [facility]  Date of Exam:   07/01/2022 Medical Rec #: [id number]           Accession #:    [id number] Date of Birth: 1941-04-12           Patient Gender: F Patient Age:   67 years Exam Location:  [facility] Procedure:      VAS US CAROTID Referring Phys: ERIC CHEN  --------------------------------------------------------------------------------  Indications:       TIA. Risk Factors:      Hyperlipidemia, prior CVA. Other Factors:     Vascular dementia, Covid-19. Comparison Study:  No prior studies. Performing Technologist: Darlin Coco RDMS, RVT  Examination Guidelines: A complete evaluation includes B-mode imaging, spectral Doppler, color Doppler, and power Doppler as needed of all accessible portions of each vessel. Bilateral testing is considered an integral part of a complete examination. Limited  examinations for reoccurring indications may be performed as noted.  Right Carotid Findings: +----------+--------+--------+--------+------------------+--------+           PSV cm/sEDV cm/sStenosisPlaque DescriptionComments +----------+--------+--------+--------+------------------+--------+ CCA Prox  86      9                                          +----------+--------+--------+--------+------------------+--------+ CCA Distal75      9                                          +----------+--------+--------+--------+------------------+--------+ ICA Prox  78      9               heterogenous mild          +----------+--------+--------+--------+------------------+--------+ ICA Mid   74      11                                         +----------+--------+--------+--------+------------------+--------+ ICA Distal73      15                                         +----------+--------+--------+--------+------------------+--------+ ECA       63                                                 +----------+--------+--------+--------+------------------+--------+ +----------+--------+-------+----------------+-------------------+           PSV cm/sEDV cmsDescribe        Arm Pressure (mmHG) +----------+--------+-------+----------------+-------------------+ [id number]            Multiphasic, WNL                     +----------+--------+-------+----------------+-------------------+ +---------+--------+--+--------+--+---------+ VertebralPSV cm/s86EDV cm/s14Antegrade +---------+--------+--+--------+--+---------+  Left Carotid Findings: +----------+--------+--------+--------+------------------+------------------+           PSV cm/sEDV cm/sStenosisPlaque DescriptionComments           +----------+--------+--------+--------+------------------+------------------+ CCA Prox  95      9                                                    +----------+--------+--------+--------+------------------+------------------+ CCA Distal71      9                                                    +----------+--------+--------+--------+------------------+------------------+ ICA Prox  90      14                                intimal thickening +----------+--------+--------+--------+------------------+------------------+ ICA Mid   70      11                                                   +----------+--------+--------+--------+------------------+------------------+  ICA Distal79      8                                                    +----------+--------+--------+--------+------------------+------------------+ ECA       114     7               heterogenous mild                    +----------+--------+--------+--------+------------------+------------------+ +----------+--------+--------+----------------+-------------------+           PSV cm/sEDV cm/sDescribe        Arm Pressure (mmHG) +----------+--------+--------+----------------+-------------------+ [id number]             Multiphasic, WNL                    +----------+--------+--------+----------------+-------------------+ +---------+--------+--+--------+--+---------+ VertebralPSV cm/s71EDV cm/s10Antegrade +---------+--------+--+--------+--+---------+   Summary: Right Carotid: The extracranial vessels were near-normal with  only minimal wall                thickening or plaque. Left Carotid: The extracranial vessels were near-normal with only minimal wall               thickening or plaque. Vertebrals:  Bilateral vertebral arteries demonstrate antegrade flow. Subclavians: Normal flow hemodynamics were seen in bilateral subclavian              arteries. *See table(s) above for measurements and observations.     Preliminary         Scheduled Meds:  amLODipine  5 mg Oral Daily   ascorbic acid  500 mg Oral Daily   aspirin EC  81 mg Oral Daily   benzonatate  100 mg Oral TID   divalproex  250 mg Oral BID   donepezil  10 mg Oral Daily   enoxaparin (LOVENOX) injection  30 mg Subcutaneous Q24H   memantine  10 mg Oral BID   nirmatrelvir/ritonavir  3 tablet Oral BID   OLANZapine zydis  2.5 mg Oral Once   sertraline  100 mg Oral Daily   zinc sulfate  220 mg Oral Daily   Continuous Infusions:  sodium chloride 100 mL/hr at 07/03/22 1039     LOS: 2 days    Time spent: 35 minutes    Irine Seal, MD [facility]   To contact the attending provider between 7A-7P or the covering provider during after hours 7P-7A, please log into the web site [email or web address] and access using universal [place] password for that web site. If you do not have the password, please call the hospital operator.  07/03/2022, 3:10 PM

## 2022-07-03 NOTE — Progress Notes (Signed)
Patient removed IV. New IV consult initiated. This will be the second IV removed by patient. Per protocol we utilized all measures of prevention. Notified physician. Due to nature of patient condition, IV is needed.  Order issued for soft restraints. Awaiting IV consult. Restraints in place.

## 2022-07-04 DIAGNOSIS — R531 Weakness: Secondary | ICD-10-CM | POA: Diagnosis not present

## 2022-07-04 DIAGNOSIS — F01518 Vascular dementia, unspecified severity, with other behavioral disturbance: Secondary | ICD-10-CM | POA: Diagnosis not present

## 2022-07-04 DIAGNOSIS — E785 Hyperlipidemia, unspecified: Secondary | ICD-10-CM | POA: Diagnosis not present

## 2022-07-04 DIAGNOSIS — U071 COVID-19: Secondary | ICD-10-CM | POA: Diagnosis not present

## 2022-07-04 LAB — BASIC METABOLIC PANEL
Anion gap: 13 (ref 5–15)
BUN: 16 mg/dL (ref 8–23)
CO2: 22 mmol/L (ref 22–32)
Calcium: 8.8 mg/dL — ABNORMAL LOW (ref 8.9–10.3)
Chloride: 102 mmol/L (ref 98–111)
Creatinine, Ser: 1.21 mg/dL — ABNORMAL HIGH (ref 0.44–1.00)
GFR, Estimated: 45 mL/min — ABNORMAL LOW (ref >60)
Glucose, Bld: 102 mg/dL — ABNORMAL HIGH (ref 70–99)
Potassium: 3.4 mmol/L — ABNORMAL LOW (ref 3.5–5.1)
Sodium: 137 mmol/L (ref 135–145)

## 2022-07-04 LAB — CBC
HCT: 37.7 % (ref 36.0–46.0)
Hemoglobin: 12.1 g/dL (ref 12.0–15.0)
MCH: 30.3 pg (ref 26.0–34.0)
MCHC: 32.1 g/dL (ref 30.0–36.0)
MCV: 94.3 fL (ref 80.0–100.0)
Platelets: 151 10*3/uL (ref 150–400)
RBC: 4 MIL/uL (ref 3.87–5.11)
RDW: 12.6 % (ref 11.5–15.5)
WBC: 8.7 10*3/uL (ref 4.0–10.5)
nRBC: 0 % (ref 0.0–0.2)

## 2022-07-04 LAB — MAGNESIUM: Magnesium: 2.2 mg/dL (ref 1.7–2.4)

## 2022-07-04 MED ORDER — POTASSIUM CHLORIDE CRYS ER 20 MEQ PO TBCR
40.0000 meq | EXTENDED_RELEASE_TABLET | Freq: Once | ORAL | Status: AC
  Administered 2022-07-04: 40 meq via ORAL
  Filled 2022-07-04: qty 2

## 2022-07-04 MED ORDER — OLANZAPINE 5 MG PO TBDP
5.0000 mg | ORAL_TABLET | Freq: Two times a day (BID) | ORAL | Status: DC
  Administered 2022-07-04: 5 mg via ORAL
  Filled 2022-07-04: qty 1

## 2022-07-04 MED ORDER — AMLODIPINE BESYLATE 5 MG PO TABS
2.5000 mg | ORAL_TABLET | Freq: Every day | ORAL | Status: DC
  Administered 2022-07-04 – 2022-07-06 (×3): 2.5 mg via ORAL
  Filled 2022-07-04 (×3): qty 1

## 2022-07-04 MED ORDER — QUETIAPINE FUMARATE 25 MG PO TABS
12.5000 mg | ORAL_TABLET | Freq: Two times a day (BID) | ORAL | Status: DC
  Administered 2022-07-04: 12.5 mg via ORAL
  Filled 2022-07-04 (×2): qty 1

## 2022-07-04 NOTE — Progress Notes (Signed)
PROGRESS NOTE    Sue Olsen  [id number] DOB: Feb 24, 1941 DOA: 07/01/2022 PCP: Ginger Organ., MD    Chief Complaint  Patient presents with   Weakness    Brief Narrative:  HPI per Dr. [name] 82 year old Caucasian female history of vascular dementia/Alzheimer type dementia, history of hyperlipidemia, prior history of stroke who presents to the ER today with inability to walk.  Family states that she has had worsening shuffling gait over the last several weeks but today was unable to walk even with a walker.  She has not had any upper respiratory symptoms.  She was mildly febrile on arrival with a temp of 100.2.  Daughter states that patient lives at [facility].  This is a independent living senior community.  She lives with her husband.  They have a caregiver that helps get the patient changed.  Daughter states that patient participated in all the community activities yesterday.  Patient did have a episode of incontinence of urine in her bed which is unusual for her.  This happened on Monday night/early Tuesday morning.   On arrival temp 98.3 heart rate 63 blood pressure 162/104 satting 99% on room air.   Labs:   UA negative for leukocyte esterase or nitrates.   COVID test was positive.  Influenza negative, RSV negative   Sodium 137, potassium 4.0, BUN of 20, creatinine 0.74   White count 7.4, hemoglobin 11.7, platelets of 117   Chest x-ray showed no acute cardiopulmonary disease   CT head was negative for acute intracranial abnormality.  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   [facility] hospital[facility] contacted for admission.    ED Course: CT head negative for acute CVA, Covid was POSITIVE      Assessment & Plan:  Principal Problem:   Generalized weakness Active Problems:   COVID-19 virus infection   Hyperlipidemia   Dementia, vascular, mixed, with behavioral disturbance (HCC)   DNR (do not resuscitate)/DNI(Do Not Intubate)   Dehydration    Assessment and  Plan: * Generalized weakness -Patient admitted with concerns of TIA versus CVA due to significant generalized lower extremity weakness.   -Head CT done with no acute intracranial abnormality.  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  -MRI brain done negative for acute abnormalities, brain atrophy and remote left frontal infarct.   -Carotid Dopplers with no significant ICA stenosis. -2D echo with EF of 55 to 60%, NWMA, no significant valvular abnormalities.  Atrial septum was grossly normal.   -Fasting lipid panel with cholesterol of 130, HDL of 61, LDL of 55.   -Initially due to concern for acute CVA permissive hypertension was allowed however MRI negative and as such patient started on low-dose Norvasc and dose increased for better blood pressure control.  -Case discussed with neurology who does not feel anything from their standpoint at this time.   -Continue IV fluids, PT/OT/ST.   -Supportive care.   COVID-19 virus infection Incidental.  -Patient without any significant respiratory symptoms on admission however during my assessment patient with an occasional cough. -Patient with extremely dry mucous membranes. -Cycle threshold noted at 29.2. -CRP < 0.5. -Due to patient's age, underlying dementia, significant dehydration patient was started on Paxlovid as patient has an increased risk for deterioration. -Continue vitamin C, zinc.   -Continue Tessalon Perles 100 mg 3 times daily. -Supportive care.  Dementia, vascular, mixed, with behavioral disturbance ([place]) Chronic.  -Patient with some agitation per RN. Continue with depakote 250 mg  bid, zoloft 100 mg daily, aricept 10 mg daily, namenda 10 mg bid. -Patient received a dose of Zyprexa on 07/03/2022 as well as the morning of 07/04/2022.   -Due to concern for worsening parkinsonism with antipsychotics, Zyprexa has been discontinued and patient will be placed on Seroquel 12.5 mg p.o. twice daily.   Discussed with neurology.    Hyperlipidemia -Fasting lipid panel with LDL of 55, total cholesterol 130.   -Held Crestor for now as patient has been started on Paxlovid and patient also presented with generalized weakness. -Resume Crestor once patient has completed course of Paxlovid.   Dehydration -Patient noted to be clinically dehydrated on examination.   -Continue gentle hydration with IV fluids for the next 24 hours.  -Supportive care.   DNR (do not resuscitate)/DNI(Do Not Intubate) Admitting physician verified DNR with pt's dtr Sue Olsen.         DVT prophylaxis: Heparin Code Status: DNR Family Communication: Updated daughter no family at bedside. Disposition: SNF hopefully in the next 1 to 2 days.  Status is: Inpatient Remains inpatient appropriate because: Severity of illness   Consultants:  Curb sided neurology:  Procedures:  Chest x-ray 07/01/2022 2D echo 07/02/2022 MRI brain 07/01/2022 Carotid Dopplers 07/01/2022   Antimicrobials:  Paxlovid 07/01/2022>>>> 07/06/2022   Subjective: Sitting up in bed.  With restraints on.  Alert.  Seems to be following commands appropriately.  Denies any chest pain.  No shortness of breath.  No abdominal pain.  States cough is improved.  RN at bedside given medications.    Objective: Vitals:   07/03/22 1410 07/03/22 2202 07/04/22 0548 07/04/22 1316  BP: (!) 134/23 (!) 168/85 (!) 110/51 120/73  Pulse: 64 68 (!) 56 64  Resp: 16 20 16 18   Temp: 98 F (36.7 C) 98 F (36.7 C) 97.8 F (36.6 C) 97.6 F (36.4 C)  TempSrc: Oral Oral Oral Oral  SpO2: 100% 99% 97% 98%  Weight:   46.2 kg   Height:        Intake/Output Summary (Last 24 hours) at 07/04/2022 1751 Last data filed at 07/04/2022 1000 Gross per 24 hour  Intake 1313.78 ml  Output 650 ml  Net 663.78 ml   Filed Weights   07/02/22 0926 07/03/22 0712 07/04/22 0548  Weight: 45.1 kg 47.2 kg 46.2 kg    Examination:  General exam: NAD.  Dry mucous membranes.  In soft  restraints.  Respiratory system: Lungs clear to auscultation bilaterally anterior lung fields.  No wheezes, no crackles, no rhonchi.  Fair air movement.  Speaking in full sentences. Cardiovascular system: Regular rate rhythm no murmurs rubs or gallops.  No JVD.  No lower extremity edema. Gastrointestinal system: Abdomen is soft, nontender, nondistended, positive bowel sounds.  No rebound.  No guarding.  Central nervous system: Alert.  Moving extremities spontaneously.  No focal neurological deficits. Extremities: Symmetric 5 x 5 power. Skin: No rashes, lesions or ulcers Psychiatry: Judgement and insight appear poor to fair. Mood & affect appropriate.     Data Reviewed:   CBC: Recent Labs  Lab 07/01/22 0057 07/02/22 0625 07/03/22 0544 07/04/22 0529  WBC 7.4 5.5 6.6 8.7  NEUTROABS 4.8 2.0  --   --   HGB 11.7* 11.3* 13.5 12.1  HCT 36.1 35.2* 40.8 37.7  MCV 94.0 94.9 91.3 94.3  PLT 117* 104* 126* [id number]    Basic Metabolic Panel: Recent Labs  Lab 07/01/22 0057 07/02/22 0625 07/03/22 0544 07/04/22 0529  NA 137 138 136 137  K 4.0 3.9  3.3* 3.4*  CL 103 101 102 102  CO2 25 26 22 22   GLUCOSE 99 90 90 102*  BUN 20 12 7* 16  CREATININE 0.74 0.65 0.37* 1.21*  CALCIUM 8.8* 8.5* 8.9 8.8*  MG  --  2.3  --  2.2    GFR: Estimated Creatinine Clearance: 26.1 mL/min (A) (by C-G formula based on SCr of 1.21 mg/dL (H)).  Liver Function Tests: Recent Labs  Lab 07/03/22 0544  AST 41  ALT 37  ALKPHOS 62  BILITOT 0.6  PROT 7.3  ALBUMIN 3.8    CBG: No results for input(s): "GLUCAP" in the last 168 hours.   Recent Results (from the past 240 hour(s))  Resp panel by RT-PCR (RSV, Flu A&B, Covid) Anterior Nasal Swab     Status: Abnormal   Collection Time: 07/01/22 12:57 AM   Specimen: Anterior Nasal Swab  Result Value Ref Range Status   SARS Coronavirus 2 by RT PCR POSITIVE (A) NEGATIVE Final    Comment: (NOTE) SARS-CoV-2 target nucleic acids are DETECTED.  The SARS-CoV-2 RNA is  generally detectable in upper respiratory specimens during the acute phase of infection. Positive results are indicative of the presence of the identified virus, but do not rule out bacterial infection or co-infection with other pathogens not detected by the test. Clinical correlation with patient history and other diagnostic information is necessary to determine patient infection status. The expected result is Negative.  Fact Sheet for Patients: EntrepreneurPulse.com.au  Fact Sheet for Healthcare Providers: IncredibleEmployment.be  This test is not yet approved or cleared by the [place] FDA and  has been authorized for detection and/or diagnosis of SARS-CoV-2 by FDA under an Emergency Use Authorization (EUA).  This EUA will remain in effect (meaning this test can be used) for the duration of  the COVID-19 declaration under Section 564(b)(1) of the A ct, 21 U.S.C. section 360bbb-3(b)(1), unless the authorization is terminated or revoked sooner.     Influenza A by PCR NEGATIVE NEGATIVE Final   Influenza B by PCR NEGATIVE NEGATIVE Final    Comment: (NOTE) The Xpert Xpress SARS-CoV-2/FLU/RSV plus assay is intended as an aid in the diagnosis of influenza from Nasopharyngeal swab specimens and should not be used as a sole basis for treatment. Nasal washings and aspirates are unacceptable for Xpert Xpress SARS-CoV-2/FLU/RSV testing.  Fact Sheet for Patients: EntrepreneurPulse.com.au  Fact Sheet for Healthcare Providers: IncredibleEmployment.be  This test is not yet approved or cleared by the [place] FDA and has been authorized for detection and/or diagnosis of SARS-CoV-2 by FDA under an Emergency Use Authorization (EUA). This EUA will remain in effect (meaning this test can be used) for the duration of the COVID-19 declaration under Section 564(b)(1) of the Act, 21 U.S.C. section 360bbb-3(b)(1),  unless the authorization is terminated or revoked.     Resp Syncytial Virus by PCR NEGATIVE NEGATIVE Final    Comment: (NOTE) Fact Sheet for Patients: EntrepreneurPulse.com.au  Fact Sheet for Healthcare Providers: IncredibleEmployment.be  This test is not yet approved or cleared by the [place] FDA and has been authorized for detection and/or diagnosis of SARS-CoV-2 by FDA under an Emergency Use Authorization (EUA). This EUA will remain in effect (meaning this test can be used) for the duration of the COVID-19 declaration under Section 564(b)(1) of the Act, 21 U.S.C. section 360bbb-3(b)(1), unless the authorization is terminated or revoked.  Performed at [facility], [place] [address], [place], [place] [address]  Radiology Studies: No results found.      Scheduled Meds:  amLODipine  2.5 mg Oral Daily   ascorbic acid  500 mg Oral Daily   aspirin EC  81 mg Oral Daily   benzonatate  100 mg Oral TID   divalproex  250 mg Oral BID   donepezil  10 mg Oral Daily   enoxaparin (LOVENOX) injection  30 mg Subcutaneous Q24H   memantine  10 mg Oral BID   nirmatrelvir/ritonavir  3 tablet Oral BID   QUEtiapine  12.5 mg Oral BID   sertraline  100 mg Oral Daily   zinc sulfate  220 mg Oral Daily   Continuous Infusions:  sodium chloride 100 mL/hr at 07/04/22 1145     LOS: 3 days    Time spent: 35 minutes    Irine Seal, MD [facility]   To contact the attending provider between 7A-7P or the covering provider during after hours 7P-7A, please log into the web site [email or web address] and access using universal [place] password for that web site. If you do not have the password, please call the hospital operator.  07/04/2022, 5:51 PM

## 2022-07-05 DIAGNOSIS — R531 Weakness: Secondary | ICD-10-CM | POA: Diagnosis not present

## 2022-07-05 DIAGNOSIS — E785 Hyperlipidemia, unspecified: Secondary | ICD-10-CM | POA: Diagnosis not present

## 2022-07-05 DIAGNOSIS — F01518 Vascular dementia, unspecified severity, with other behavioral disturbance: Secondary | ICD-10-CM | POA: Diagnosis not present

## 2022-07-05 DIAGNOSIS — U071 COVID-19: Secondary | ICD-10-CM | POA: Diagnosis not present

## 2022-07-05 LAB — CBC WITH DIFFERENTIAL/PLATELET
Abs Immature Granulocytes: 0.02 10*3/uL (ref 0.00–0.07)
Basophils Absolute: 0 10*3/uL (ref 0.0–0.1)
Basophils Relative: 0 %
Eosinophils Absolute: 0.1 10*3/uL (ref 0.0–0.5)
Eosinophils Relative: 1 %
HCT: 37.9 % (ref 36.0–46.0)
Hemoglobin: 11.8 g/dL — ABNORMAL LOW (ref 12.0–15.0)
Immature Granulocytes: 0 %
Lymphocytes Relative: 43 %
Lymphs Abs: 3.7 10*3/uL (ref 0.7–4.0)
MCH: 30.2 pg (ref 26.0–34.0)
MCHC: 31.1 g/dL (ref 30.0–36.0)
MCV: 96.9 fL (ref 80.0–100.0)
Monocytes Absolute: 0.9 10*3/uL (ref 0.1–1.0)
Monocytes Relative: 10 %
Neutro Abs: 3.9 10*3/uL (ref 1.7–7.7)
Neutrophils Relative %: 46 %
Platelets: 131 10*3/uL — ABNORMAL LOW (ref 150–400)
RBC: 3.91 MIL/uL (ref 3.87–5.11)
RDW: 12.8 % (ref 11.5–15.5)
WBC: 8.5 10*3/uL (ref 4.0–10.5)
nRBC: 0 % (ref 0.0–0.2)

## 2022-07-05 LAB — BASIC METABOLIC PANEL
Anion gap: 10 (ref 5–15)
BUN: 15 mg/dL (ref 8–23)
CO2: 23 mmol/L (ref 22–32)
Calcium: 9.1 mg/dL (ref 8.9–10.3)
Chloride: 108 mmol/L (ref 98–111)
Creatinine, Ser: 0.8 mg/dL (ref 0.44–1.00)
GFR, Estimated: >60 mL/min (ref >60)
Glucose, Bld: 94 mg/dL (ref 70–99)
Potassium: 4.2 mmol/L (ref 3.5–5.1)
Sodium: 141 mmol/L (ref 135–145)

## 2022-07-05 LAB — MAGNESIUM: Magnesium: 2.1 mg/dL (ref 1.7–2.4)

## 2022-07-05 MED ORDER — QUETIAPINE FUMARATE 25 MG PO TABS
12.5000 mg | ORAL_TABLET | Freq: Every evening | ORAL | Status: DC | PRN
  Administered 2022-07-05 – 2022-07-06 (×2): 12.5 mg via ORAL
  Filled 2022-07-05 (×2): qty 1

## 2022-07-05 MED ORDER — SODIUM CHLORIDE 0.9 % IV SOLN: INTRAVENOUS | Status: DC

## 2022-07-05 MED ORDER — ENOXAPARIN SODIUM 40 MG/0.4ML IJ SOSY
40.0000 mg | PREFILLED_SYRINGE | INTRAMUSCULAR | Status: DC
  Administered 2022-07-06 – 2022-07-07 (×2): 40 mg via SUBCUTANEOUS
  Filled 2022-07-05 (×2): qty 0.4

## 2022-07-05 NOTE — Progress Notes (Signed)
Physical Therapy Treatment Patient Details Name: Sue Olsen MRN: [id number] DOB: 08/10/1940 Today's Date: 07/05/2022   History of Present Illness 82 year old Caucasian female history of vascular dementia/Alzheimer type dementia, history of hyperlipidemia, prior history of stroke who presents to the ER today with inability to walk.  Pt tested positive for COVID once admitted.    PT Comments    Pt resides at [facility] with Spouse Pt already OOB in recliner.  General Comments: AxO x 1 Hx Dementia but following all commands and able to express self "I'm cold".  Pleasant Lady.  Supportive Daughter in room. Assisted out of recliner to amb to bathroom was difficult.  General transfer comment: pt tends to "pull up" using walker requiring Mod/Min Assist present with forward posture and instability.  Also assisted with a toilet transfer which required increased assist esp with completing turns and safely "targeting" center toilet/recliner.  Pt tends to sit to early and too far away.  Required redirection. General Gait Details: VERY limited amb distance of 12 feet to bathroom requiring + 2 assist.  75% VC's on proper use of walker as pt tends to push out way too far.  Short shuffled steps with poor forward flexed posture.  Increasingly unsteady with turns and back steps.  HIGH FALL RISK.  Assisted back to recliner and positioned to comfort.   Pt will need ST Rehab at SNF to address mobility and functional decline prior to safely returning home.    Recommendations for follow up therapy are one component of a multi-disciplinary discharge planning process, led by the attending physician.  Recommendations may be updated based on patient status, additional functional criteria and insurance authorization.  Follow Up Recommendations  Skilled nursing-short term rehab (<3 hours/day) Can patient physically be transported by private vehicle: Yes   Assistance Recommended at Discharge Intermittent  Supervision/Assistance  Patient can return home with the following A lot of help with walking and/or transfers;Assistance with cooking/housework;Assist for transportation   Equipment Recommendations  Rolling walker (2 wheels)    Recommendations for Other Services       Precautions / Restrictions Precautions Precautions: Fall Precaution Comments: COVID Restrictions Weight Bearing Restrictions: No     Mobility  Bed Mobility               General bed mobility comments: OOB via Occupational Therapist    Transfers Overall transfer level: Needs assistance Equipment used: Rolling walker (2 wheels) Transfers: Sit to/from Stand Sit to Stand: Min assist, Mod assist Stand pivot transfers: Mod assist, Max assist         General transfer comment: pt tends to "pull up" using walker requiring Mod/Min Assist present with forward posture and instability.  Also assisted with a toilet transfer which required increased assist esp with completing turns and safely "targeting" center toilet/recliner.  Pt tends to sit to early and too far away.  Required redirection.    Ambulation/Gait Ambulation/Gait assistance: Min assist, Mod assist Gait Distance (Feet): 12 Feet Assistive device: Rolling walker (2 wheels) Gait Pattern/deviations: Step-to pattern, Narrow base of support, Trunk flexed Gait velocity: decreased     General Gait Details: VERY limited amb distance of 12 feet to bathroom requiring + 2 assist.  75% VC's on proper use of walker as pt tends to push out way too far.  Short shuffled steps with poor forward flexed posture.  Increasingly unsteady with turns and back steps.  HIGH FALL RISK.   Stairs  Wheelchair Mobility    Modified Rankin (Stroke Patients Only)       Balance                                            Cognition Arousal/Alertness: Awake/alert Behavior During Therapy: WFL for tasks assessed/performed Overall Cognitive  Status: History of cognitive impairments - at baseline                                 General Comments: AxO x 1 Hx Dementia but following all commands and able to express self "I'm cold".  Pleasant Lady.  Supportive Daughter in room.        Exercises      General Comments        Pertinent Vitals/Pain Pain Assessment Pain Assessment: No/denies pain    Home Living                          Prior Function            PT Goals (current goals can now be found in the care plan section) Progress towards PT goals: Progressing toward goals    Frequency    Min 2X/week      PT Plan Current plan remains appropriate    Co-evaluation              AM-PAC PT "6 Clicks" Mobility   Outcome Measure  Help needed turning from your back to your side while in a flat bed without using bedrails?: A Little Help needed moving from lying on your back to sitting on the side of a flat bed without using bedrails?: A Little Help needed moving to and from a bed to a chair (including a wheelchair)?: A Little Help needed standing up from a chair using your arms (e.g., wheelchair or bedside chair)?: A Lot Help needed to walk in hospital room?: A Lot Help needed climbing 3-5 steps with a railing? : Total 6 Click Score: 14    End of Session Equipment Utilized During Treatment: Gait belt Activity Tolerance: Patient tolerated treatment well Patient left: in chair;with chair alarm set;with call bell/phone within reach;with family/visitor present Nurse Communication: Mobility status PT Visit Diagnosis: Difficulty in walking, not elsewhere classified (R26.2)     Time: [id number] PT Time Calculation (min) (ACUTE ONLY): 27 min  Charges:  $Gait Training: 8-22 mins $Therapeutic Activity: 8-22 mins                     Rica Koyanagi  PTA [facility] M-F          [phone] Weekend pager [phone]

## 2022-07-05 NOTE — Progress Notes (Signed)
Occupational Therapy Treatment Patient Details Name: Sue Olsen MRN: [id number] DOB: May 13, 1940 Today's Date: 07/05/2022   History of present illness 82 year old Caucasian female history of vascular dementia/Alzheimer type dementia, history of hyperlipidemia, prior history of stroke who presents to the ER today with inability to walk.  Pt tested positive for COVID once admitted.   OT comments  Patient not progressing today, and showed increased need of assistance with feeding and grooming, compared to previous session. Pt oriented to person only, but following commands, however remained lethargic throughout session with poor sitting balance, showing anterior and LT leaning with need of at least Min guard to sit safely at EOB during grooming.  Pt also being hand fed by daughter and making no attempt to feed herself. Daughter did agree to work with pt on this and asked for mitts to stay off hands to allow pt to self feed. RN notified of this.  Pt required Max As for bed mobility and up to Max As to safely pivot to recliner with RW. Very tremulous.   Patient remains limited by cognitive deficits, decreased alertness, tremors, generalized weakness and decreased activity tolerance along with deficits noted below. Pt continues to demonstrate fair rehab potential and would benefit from continued skilled OT to increase safety and independence with ADLs and functional transfers to allow pt to return home safely and reduce caregiver burden and fall risk.    Recommendations for follow up therapy are one component of a multi-disciplinary discharge planning process, led by the attending physician.  Recommendations may be updated based on patient status, additional functional criteria and insurance authorization.    Follow Up Recommendations  Skilled nursing-short term rehab (<3 hours/day)     Assistance Recommended at Discharge Frequent or constant Supervision/Assistance  Patient can return home with  the following  A little help with walking and/or transfers;Direct supervision/assist for medications management;A lot of help with bathing/dressing/bathroom;Direct supervision/assist for financial management;Assist for transportation;Assistance with cooking/housework;Assistance with feeding   Equipment Recommendations  None recommended by OT    Recommendations for Other Services      Precautions / Restrictions Precautions Precautions: Fall Precaution Comments: COVID Restrictions Weight Bearing Restrictions: No       Mobility Bed Mobility Overal bed mobility: Needs Assistance Bed Mobility: Supine to Sit     Supine to sit: Max assist, HOB elevated          Transfers                         Balance Overall balance assessment: History of Falls, Needs assistance Sitting-balance support: Single extremity supported, Feet supported Sitting balance-Leahy Scale: Poor Sitting balance - Comments: Lethargy contributed to poor balance today. Postural control: Left lateral lean (and Anterior lean) Standing balance support: Bilateral upper extremity supported, During functional activity Standing balance-Leahy Scale: Poor               High level balance activites: Side stepping             ADL either performed or assessed with clinical judgement   ADL Overall ADL's : Needs assistance/impaired   Eating/Feeding Details (indicate cue type and reason): Daughter hand fed pt one bite of waffle. Pt refused any more. Grooming: Sitting;Moderate assistance;Cueing for sequencing Grooming Details (indicate cue type and reason): At EOB, pt provided with washcloth and wiped mouth but could not reach up to wipe eyes. Dtr assisted and wiped pt's eyes for her.  Upper Body Dressing : Maximal assistance;Sitting Upper Body Dressing Details (indicate cue type and reason): EOB: Pt changed anterior and posterior gowns with Max As. Pt minimally lifting UEs to assist. Lower  Body Dressing: Total assistance;Cueing for sequencing;Bed level   Toilet Transfer: Stand-pivot;Rolling walker (2 wheels);Cueing for sequencing;Moderate assistance;Maximal assistance;Cueing for safety Toilet Transfer Details (indicate cue type and reason): Pt stood from EOB to RW with Moderate assist. Pt took ~4 lateral steps with RW and Mod As, then pivoted to recliner with Max As due to unsteadiness and max cues to line self up safely with recliner prior to sitting. Toileting- Clothing Manipulation and Hygiene: Total assistance;Sit to/from stand;Bed level;Cueing for sequencing Toileting - Clothing Manipulation Details (indicate cue type and reason): Using pure wick which was replaced with a new one once pt in recliner.     Functional mobility during ADLs: Cueing for sequencing;Cueing for safety;Rolling walker (2 wheels);Moderate assistance;Maximal assistance      Extremity/Trunk Assessment Upper Extremity Assessment RUE Deficits / Details: Tremulous throughout session. Tremors worsened since last seen by this OT and dat agrees.  Pt reports feeling cold. Blankets provided with pt still with UE/hand tremor. RUE Coordination: decreased fine motor LUE Deficits / Details: Tremulous throughout session. Pt reports feeling cold. blanket provided with pt still with UE/hand tremor. LUE Coordination: decreased fine motor       Cervical / Trunk Assessment Cervical / Trunk Assessment: Kyphotic    Vision       Perception     Praxis      Cognition Arousal/Alertness: Lethargic Behavior During Therapy: WFL for tasks assessed/performed Overall Cognitive Status: History of cognitive impairments - at baseline                                 General Comments: AxO x 1 Hx Dementia but following all commands and able to express self "I'm cold".  [place].  Supportive Daughter in room.        Exercises Other Exercises Other Exercises: AAROM to [place] for [employer] press while in  recliner. Max encouragement for each rep, and pt too fatigued after this 1 set to continue any more exercises. Pt fell asleep quickly after.    Shoulder Instructions       General Comments      Pertinent Vitals/ Pain       Pain Assessment Pain Assessment: Faces Faces Pain Scale: Hurts a little bit Pain Location: Pt endorsed "mild" back pain. Pain Intervention(s): Limited activity within patient's tolerance, Monitored during session, Repositioned  Home Living                                          Prior Functioning/Environment              Frequency  Min 2X/week        Progress Toward Goals  OT Goals(current goals can now be found in the care plan section)  Progress towards OT goals: Not progressing toward goals - comment  Acute Rehab OT Goals Patient Stated Goal: Per daughter: For pt to get out of bed today. OT Goal Formulation: With family Time For Goal Achievement: 07/16/22 Potential to Achieve Goals: [place] Discharge plan needs to be updated    Co-evaluation  AM-PAC OT "6 Clicks" Daily Activity     Outcome Measure   Help from another person eating meals?: A Lot Help from another person taking care of personal grooming?: A Lot Help from another person toileting, which includes using toliet, bedpan, or urinal?: Total Help from another person bathing (including washing, rinsing, drying)?: A Lot Help from another person to put on and taking off regular upper body clothing?: A Lot Help from another person to put on and taking off regular lower body clothing?: Total 6 Click Score: 10    End of Session Equipment Utilized During Treatment: Gait belt;Rolling walker (2 wheels)  OT Visit Diagnosis: Unsteadiness on feet (R26.81);Other symptoms and signs involving cognitive function;Cognitive communication deficit (R41.841);Feeding difficulties (R63.3);History of falling (Z91.81);Muscle weakness (generalized)  (M62.81);Apraxia (R48.2)   Activity Tolerance Patient limited by fatigue;Patient limited by lethargy   Patient Left with call bell/phone within reach;with family/visitor present;in chair;with chair alarm set   Nurse Communication Other (comment) (Pill found on pt's bed. Daughter to apply mitts as dtr refused for OT to do for now.)        Time: 1108-1144 OT Time Calculation (min): 36 min  Charges: OT General Charges $OT Visit: 1 Visit OT Treatments $Self Care/Home Management : 8-22 mins $Therapeutic Activity: 8-22 mins  Anderson [place], [place] Office: [phone] 07/05/2022  Julien Girt 07/05/2022, 1:53 PM

## 2022-07-05 NOTE — [place] FL2 (Signed)
[place] MEDICAID FL2 LEVEL OF CARE FORM     IDENTIFICATION  Patient Name: Sue Olsen Birthdate: 1940-10-23 Sex: female Admission Date (Current Location): 07/01/2022  [facility] and [place] Number:  Herbalist and Address:  [facility] [place], [place] [address] [place], [place] [address]      Provider Number: [id number]  Attending Physician Name and Address:  Eugenie Filler, MD  Relative Name and Phone Number:  Alvy Beal (Daughter)  [phone] [facility])    Current Level of Care: Hospital Recommended Level of Care: [place] Prior Approval Number:    Date Approved/Denied:   PASRR Number: [id number]  Discharge Plan: SNF    Current Diagnoses: Patient Active Problem List   Diagnosis Date Noted   Generalized weakness 07/01/2022   COVID-19 virus infection 07/01/2022   DNR (do not resuscitate)/DNI(Do Not Intubate) 07/01/2022   Dehydration 07/01/2022   Dementia, vascular, mixed, with behavioral disturbance ([place]) 03/01/2022   Hyperlipidemia 12/22/2012   Vitamin D deficiency 12/22/2012   Hematuria 12/21/2012   White coat syndrome without diagnosis of hypertension 12/19/2012   Allergic rhinitis 08/04/2011   Seborrheic dermatitis 07/14/2010    Orientation RESPIRATION BLADDER Height & Weight     Self, Place  Normal Incontinent, External catheter Weight: 105 lb 13.1 oz (48 kg) Height:  5\' 4"  (162.6 cm)  BEHAVIORAL SYMPTOMS/MOOD NEUROLOGICAL BOWEL NUTRITION STATUS      Incontinent Diet (see d/c summary)  AMBULATORY STATUS COMMUNICATION OF NEEDS Skin   Extensive Assist Verbally Normal                       Personal Care Assistance Level of Assistance  Bathing, Feeding, Dressing Bathing Assistance: Maximum assistance Feeding assistance: Independent Dressing Assistance: Maximum assistance     Functional Limitations Info  Sight, Hearing, Speech Sight Info: Adequate Hearing Info: Adequate Speech Info:  Adequate    SPECIAL CARE FACTORS FREQUENCY  PT (By licensed PT), OT (By licensed OT)     PT Frequency: 5x/week OT Frequency: 5x/week            Contractures Contractures Info: Not present    Additional Factors Info  Code Status, Allergies, Isolation Precautions Code Status Info: DNR Allergies Info: Cephalexin, Cephalosporins, Azithromycin     Isolation Precautions Info: Incidental covid+ on 07/01/22. No significant symptoms     Current Medications (07/05/2022):  This is the current hospital active medication list Current Facility-Administered Medications  Medication Dose Route Frequency Provider Last Rate Last Admin   0.9 %  sodium chloride infusion   Intravenous Continuous Eugenie Filler, MD 75 mL/hr at 07/05/22 1037 New Bag at 07/05/22 1037   acetaminophen (TYLENOL) tablet 650 mg  650 mg Oral Q4H PRN Kristopher Oppenheim, DO   650 mg at 07/03/22 2229   Or   acetaminophen (TYLENOL) 160 MG/5ML solution 650 mg  650 mg Per Tube Q4H PRN Kristopher Oppenheim, DO       Or   acetaminophen (TYLENOL) suppository 650 mg  650 mg Rectal Q4H PRN Kristopher Oppenheim, DO       amLODipine (NORVASC) tablet 2.5 mg  2.5 mg Oral Daily Eugenie Filler, MD   2.5 mg at 07/05/22 1013   ascorbic acid (VITAMIN C) tablet 500 mg  500 mg Oral Daily Eugenie Filler, MD   500 mg at 07/05/22 1014   aspirin EC tablet 81 mg  81 mg Oral Daily Kristopher Oppenheim, DO   81 mg at 07/05/22 1014  benzonatate (TESSALON) capsule 100 mg  100 mg Oral TID Eugenie Filler, MD   100 mg at 07/05/22 1012   diazepam (VALIUM) injection 5 mg  5 mg Intravenous Once PRN Kristopher Oppenheim, DO       divalproex (DEPAKOTE) DR tablet 250 mg  250 mg Oral BID Kristopher Oppenheim, DO   250 mg at 07/05/22 1014   donepezil (ARICEPT) tablet 10 mg  10 mg Oral Daily Kristopher Oppenheim, DO   10 mg at 07/05/22 1013   [START ON 07/06/2022] enoxaparin (LOVENOX) injection 40 mg  40 mg Subcutaneous Q24H Karren Cobble, RPH       memantine [facility]) tablet 10 mg  10 mg Oral BID Kristopher Oppenheim,  DO   10 mg at 07/05/22 1014   nirmatrelvir/ritonavir (PAXLOVID) 3 tablet  3 tablet Oral BID Eugenie Filler, MD   3 tablet at 07/05/22 1014   ondansetron (ZOFRAN) injection 4 mg  4 mg Intravenous Q6H PRN Kristopher Oppenheim, DO       QUEtiapine (SEROQUEL) tablet 12.5 mg  12.5 mg Oral QHS PRN Eugenie Filler, MD       sertraline (ZOLOFT) tablet 100 mg  100 mg Oral Daily Kristopher Oppenheim, DO   100 mg at 07/05/22 1014   zinc sulfate capsule 220 mg  220 mg Oral Daily Eugenie Filler, MD   220 mg at 07/05/22 1013     Discharge Medications: Please see discharge summary for a list of discharge medications.  Relevant Imaging Results:  Relevant Lab Results:   Additional Information SSN [place], [place]

## 2022-07-05 NOTE — Progress Notes (Signed)
PROGRESS NOTE    Sue Olsen  [id number] DOB: August 02, 1940 DOA: 07/01/2022 PCP: Ginger Organ., MD    Chief Complaint  Patient presents with   Weakness    Brief Narrative:  HPI per Dr. [name] 82 year old Caucasian female history of vascular dementia/Alzheimer type dementia, history of hyperlipidemia, prior history of stroke who presents to the ER today with inability to walk.  Family states that she has had worsening shuffling gait over the last several weeks but today was unable to walk even with a walker.  She has not had any upper respiratory symptoms.  She was mildly febrile on arrival with a temp of 100.2.  Daughter states that patient [facility] at [facility].  This is a independent living senior community.  She lives with her husband.  They have a caregiver that helps get the patient changed.  Daughter states that patient participated in all the community activities yesterday.  Patient did have a episode of incontinence of urine in her bed which is unusual for her.  This happened on Monday night/early Tuesday morning.   On arrival temp 98.3 heart rate 63 blood pressure 162/104 satting 99% on room air.   Labs:   UA negative for leukocyte esterase or nitrates.   COVID test was positive.  Influenza negative, RSV negative   Sodium 137, potassium 4.0, BUN of 20, creatinine 0.74   White count 7.4, hemoglobin 11.7, platelets of 117   Chest x-ray showed no acute cardiopulmonary disease   CT head was negative for acute intracranial abnormality.  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   [facility] hospitalist contacted for admission.    ED Course: CT head negative for acute CVA, Covid was POSITIVE      Assessment & Plan:  Principal Problem:   Generalized weakness Active Problems:   COVID-19 virus infection   Hyperlipidemia   Dementia, vascular, mixed, with behavioral disturbance (HCC)   DNR (do not resuscitate)/DNI(Do Not Intubate)   Dehydration    Assessment and  Plan: * Generalized weakness -Patient admitted with concerns of TIA versus CVA due to significant generalized lower extremity weakness.   -Head CT done with no acute intracranial abnormality.  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  -MRI brain done negative for acute abnormalities, brain atrophy and remote left frontal infarct.   -Carotid Dopplers with no significant ICA stenosis. -2D echo with EF of 55 to 60%, NWMA, no significant valvular abnormalities.  Atrial septum was grossly normal.   -Fasting lipid panel with cholesterol of 130, HDL of 61, LDL of 55.   -Initially due to concern for acute CVA permissive hypertension was allowed however MRI negative and as such patient started on low-dose Norvasc. -Case discussed with neurology who does not feel anything from their standpoint at this time.   -Continue IV fluids, PT/OT/ST.   -Supportive care.   COVID-19 virus infection Incidental.  -Patient without any significant respiratory symptoms on admission however during my assessment patient with an occasional cough. -Patient with extremely dry mucous membranes. -Cycle threshold noted at 29.2. -CRP < 0.5. -Due to patient's age, underlying dementia, significant dehydration patient was started on Paxlovid as patient has an increased risk for deterioration. -Continue vitamin C, zinc.   -Continue Tessalon Perles 100 mg 3 times daily. -Supportive care.  Dementia, vascular, mixed, with behavioral disturbance ([place]) Chronic.  -Patient with some agitation per RN. Continue with depakote 250 mg bid, zoloft 100 mg daily, aricept 10 mg daily,  namenda 10 mg bid. -Patient received a dose of Zyprexa on 07/03/2022 as well as the morning of 07/04/2022.   -Due to concern for worsening parkinsonism with antipsychotics, Zyprexa has been discontinued and patient was placed on Seroquel 12.5 mg p.o. twice daily.  Discussed with neurology.  -Patient sleepy this morning and as  such we will change Seroquel to 12.5 mg p.o. nightly as needed.   -Outpatient follow-up with primary neurologist.   Hyperlipidemia -Fasting lipid panel with LDL of 55, total cholesterol 130.   -Continue to hold Crestor as patient on Paxlovid and had presented with generalized weakness.   -Could likely resume Crestor 1 week postdischarge.  Dehydration -Patient noted to be clinically dehydrated on examination.   -Continue gentle hydration with IV fluids.   -Supportive care.    DNR (do not resuscitate)/DNI(Do Not Intubate) Admitting physician verified DNR with pt's dtr beverly.         DVT prophylaxis: Heparin Code Status: DNR Family Communication: Updated patient.  Called to update daughter, Alvy Beal on the telephone however went to voicemail.  Disposition: SNF hopefully tomorrow.  Status is: Inpatient Remains inpatient appropriate because: Severity of illness   Consultants:  Curb sided neurology:  Procedures:  Chest x-ray 07/01/2022 2D echo 07/02/2022 MRI brain 07/01/2022 Carotid Dopplers 07/01/2022   Antimicrobials:  Paxlovid 07/01/2022>>>> 07/06/2022   Subjective: Patient sleeping but easily arousable.  Noted to have received Seroquel last night.  States not sure how she is feeling that she just woke up.  Denies any ongoing chest pain.  No shortness of breath.  RN at bedside.    Objective: Vitals:   07/04/22 1316 07/04/22 2038 07/05/22 0500 07/05/22 0604  BP: 120/73 (!) 173/66  (!) 182/61  Pulse: 64 63  (!) 53  Resp: 18 20  18   Temp: 97.6 F (36.4 C) 97.8 F (36.6 C)  (!) 97 F (36.1 C)  TempSrc: Oral Oral  Oral  SpO2: 98% 95%  98%  Weight:   48 kg   Height:        Intake/Output Summary (Last 24 hours) at 07/05/2022 1028 Last data filed at 07/05/2022 0056 Gross per 24 hour  Intake 1250 ml  Output 650 ml  Net 600 ml   Filed Weights   07/03/22 0712 07/04/22 0548 07/05/22 0500  Weight: 47.2 kg 46.2 kg 48 kg    Examination:  General exam: NAD.   Dry mucous membranes. Respiratory system: CTAB anterior lung fields.  No wheezes, no crackles, no rhonchi.  Fair air movement.  Speaking in full sentences.  Cardiovascular system: RRR no murmurs rubs or gallops.  No JVD.  No lower extremity edema.  Gastrointestinal system: Abdomen is soft, nontender, nondistended, positive bowel sounds.  No rebound.  No guarding.  Central nervous system: Alert.  Moving extremities spontaneously.  No focal neurological deficits. Extremities: Symmetric 5 x 5 power. Skin: No rashes, lesions or ulcers Psychiatry: Judgement and insight appear poor to fair. Mood & affect appropriate.     Data Reviewed:   CBC: Recent Labs  Lab 07/01/22 0057 07/02/22 0625 07/03/22 0544 07/04/22 0529 07/05/22 0541  WBC 7.4 5.5 6.6 8.7 8.5  NEUTROABS 4.8 2.0  --   --  3.9  HGB 11.7* 11.3* 13.5 12.1 11.8*  HCT 36.1 35.2* 40.8 37.7 37.9  MCV 94.0 94.9 91.3 94.3 96.9  PLT 117* 104* 126* 151 131*    Basic Metabolic Panel: Recent Labs  Lab 07/01/22 0057 07/02/22 [id number] 07/03/22 0544 07/04/22 0529 07/05/22 0541  NA 137 138 136 137 141  K 4.0 3.9 3.3* 3.4* 4.2  CL 103 101 102 102 108  CO2 25 26 22 22 23   GLUCOSE 99 90 90 102* 94  BUN 20 12 7* 16 15  CREATININE 0.74 0.65 0.37* 1.21* 0.80  CALCIUM 8.8* 8.5* 8.9 8.8* 9.1  MG  --  2.3  --  2.2 2.1    GFR: Estimated Creatinine Clearance: 41.1 mL/min (by C-G formula based on SCr of 0.8 mg/dL).  Liver Function Tests: Recent Labs  Lab 07/03/22 0544  AST 41  ALT 37  ALKPHOS 62  BILITOT 0.6  PROT 7.3  ALBUMIN 3.8    CBG: No results for input(s): "GLUCAP" in the last 168 hours.   Recent Results (from the past 240 hour(s))  Resp panel by RT-PCR (RSV, Flu A&B, Covid) Anterior Nasal Swab     Status: Abnormal   Collection Time: 07/01/22 12:57 AM   Specimen: Anterior Nasal Swab  Result Value Ref Range Status   SARS Coronavirus 2 by RT PCR POSITIVE (A) NEGATIVE Final    Comment: (NOTE) SARS-CoV-2 target nucleic  acids are DETECTED.  The SARS-CoV-2 RNA is generally detectable in upper respiratory specimens during the acute phase of infection. Positive results are indicative of the presence of the identified virus, but do not rule out bacterial infection or co-infection with other pathogens not detected by the test. Clinical correlation with patient history and other diagnostic information is necessary to determine patient infection status. The expected result is Negative.  Fact Sheet for Patients: EntrepreneurPulse.com.au  Fact Sheet for Healthcare Providers: IncredibleEmployment.be  This test is not yet approved or cleared by the [place] FDA and  has been authorized for detection and/or diagnosis of SARS-CoV-2 by FDA under an Emergency Use Authorization (EUA).  This EUA will remain in effect (meaning this test can be used) for the duration of  the COVID-19 declaration under Section 564(b)(1) of the A ct, 21 U.S.C. section 360bbb-3(b)(1), unless the authorization is terminated or revoked sooner.     Influenza A by PCR NEGATIVE NEGATIVE Final   Influenza B by PCR NEGATIVE NEGATIVE Final    Comment: (NOTE) The Xpert Xpress SARS-CoV-2/FLU/RSV plus assay is intended as an aid in the diagnosis of influenza from Nasopharyngeal swab specimens and should not be used as a sole basis for treatment. Nasal washings and aspirates are unacceptable for Xpert Xpress SARS-CoV-2/FLU/RSV testing.  Fact Sheet for Patients: EntrepreneurPulse.com.au  Fact Sheet for Healthcare Providers: IncredibleEmployment.be  This test is not yet approved or cleared by the [place] FDA and has been authorized for detection and/or diagnosis of SARS-CoV-2 by FDA under an Emergency Use Authorization (EUA). This EUA will remain in effect (meaning this test can be used) for the duration of the COVID-19 declaration under Section 564(b)(1) of  the Act, 21 U.S.C. section 360bbb-3(b)(1), unless the authorization is terminated or revoked.     Resp Syncytial Virus by PCR NEGATIVE NEGATIVE Final    Comment: (NOTE) Fact Sheet for Patients: EntrepreneurPulse.com.au  Fact Sheet for Healthcare Providers: IncredibleEmployment.be  This test is not yet approved or cleared by the [place] FDA and has been authorized for detection and/or diagnosis of SARS-CoV-2 by FDA under an Emergency Use Authorization (EUA). This EUA will remain in effect (meaning this test can be used) for the duration of the COVID-19 declaration under Section 564(b)(1) of the Act, 21 U.S.C. section 360bbb-3(b)(1), unless the authorization is terminated or revoked.  Performed at [facility], [place] [address] [place], [place] [address]          Radiology Studies: No results found.      Scheduled Meds:  amLODipine  2.5 mg Oral Daily   ascorbic acid  500 mg Oral Daily   aspirin EC  81 mg Oral Daily   benzonatate  100 mg Oral TID   divalproex  250 mg Oral BID   donepezil  10 mg Oral Daily   enoxaparin (LOVENOX) injection  30 mg Subcutaneous Q24H   memantine  10 mg Oral BID   nirmatrelvir/ritonavir  3 tablet Oral BID   sertraline  100 mg Oral Daily   zinc sulfate  220 mg Oral Daily   Continuous Infusions:  sodium chloride       LOS: 4 days    Time spent: 35 minutes    Irine Seal, MD [facility]   To contact the attending provider between 7A-7P or the covering provider during after hours 7P-7A, please log into the web site [email or web address] and access using universal [place] password for that web site. If you do not have the password, please call the hospital operator.  07/05/2022, 10:28 AM

## 2022-07-06 DIAGNOSIS — U071 COVID-19: Secondary | ICD-10-CM | POA: Diagnosis not present

## 2022-07-06 DIAGNOSIS — E785 Hyperlipidemia, unspecified: Secondary | ICD-10-CM | POA: Diagnosis not present

## 2022-07-06 DIAGNOSIS — F01518 Vascular dementia, unspecified severity, with other behavioral disturbance: Secondary | ICD-10-CM | POA: Diagnosis not present

## 2022-07-06 DIAGNOSIS — R531 Weakness: Secondary | ICD-10-CM | POA: Diagnosis not present

## 2022-07-06 LAB — BASIC METABOLIC PANEL
Anion gap: 7 (ref 5–15)
BUN: 9 mg/dL (ref 8–23)
CO2: 26 mmol/L (ref 22–32)
Calcium: 8.8 mg/dL — ABNORMAL LOW (ref 8.9–10.3)
Chloride: 107 mmol/L (ref 98–111)
Creatinine, Ser: 0.59 mg/dL (ref 0.44–1.00)
GFR, Estimated: >60 mL/min (ref >60)
Glucose, Bld: 106 mg/dL — ABNORMAL HIGH (ref 70–99)
Potassium: 3.6 mmol/L (ref 3.5–5.1)
Sodium: 140 mmol/L (ref 135–145)

## 2022-07-06 MED ORDER — QUETIAPINE FUMARATE 25 MG PO TABS: 12.5000 mg | ORAL_TABLET | Freq: Every evening | ORAL | Status: DC | PRN

## 2022-07-06 MED ORDER — ZINC SULFATE 220 (50 ZN) MG PO CAPS: 220.0000 mg | ORAL_CAPSULE | Freq: Every day | ORAL | 0 refills | Status: AC

## 2022-07-06 MED ORDER — ASPIRIN 81 MG PO TBEC: 81.0000 mg | DELAYED_RELEASE_TABLET | Freq: Every day | ORAL | 12 refills | Status: DC

## 2022-07-06 MED ORDER — ASCORBIC ACID 500 MG PO TABS: 500.0000 mg | ORAL_TABLET | Freq: Every day | ORAL | 0 refills | Status: AC

## 2022-07-06 MED ORDER — AMLODIPINE BESYLATE 2.5 MG PO TABS: 2.5000 mg | ORAL_TABLET | Freq: Every day | ORAL | 1 refills | Status: DC

## 2022-07-06 MED ORDER — BENZONATATE 100 MG PO CAPS: 100.0000 mg | ORAL_CAPSULE | Freq: Three times a day (TID) | ORAL | 0 refills | Status: AC

## 2022-07-06 NOTE — TOC Progression Note (Signed)
Transition of Care [facility]) - Progression Note    Patient Details  Name: Sue Olsen MRN: [id number] Date of Birth: 06-05-1940  Transition of Care [facility]) CM/SW Contact  Purcell Mouton, RN Phone Number: 07/06/2022, 2:07 PM  Clinical Narrative:     SNF offers given today. Daughter will need to visit SNF's to select one. Will inform CM in the AM.   Expected Discharge Plan: [place] Barriers to Discharge: Continued Medical Work up  Expected Discharge Plan and [place] arrangements for the past 2 months: [place]                                       Social Determinants of Health (SDOH) Interventions SDOH Screenings   Food Insecurity: No Food Insecurity (07/01/2022)  Housing: [place]  (07/01/2022)  Transportation Needs: No Transportation Needs (07/01/2022)  Utilities: Not At Risk (07/01/2022)  Depression (PHQ2-9): Low Risk  (05/15/2019)  Tobacco Use: Low Risk  (07/01/2022)    Readmission Risk Interventions     No data to display

## 2022-07-06 NOTE — Plan of Care (Signed)
  Problem: Education: Goal: Knowledge of disease or condition will improve Outcome: Progressing Goal: Knowledge of secondary prevention will improve (MUST DOCUMENT ALL) Outcome: Progressing Goal: Knowledge of patient specific risk factors will improve [place]ta [place] N/A or DELETE if not current risk factor) Outcome: Progressing   Problem: Ischemic Stroke/TIA Tissue Perfusion: Goal: Complications of ischemic stroke/TIA will be minimized Outcome: Progressing   Problem: Coping: Goal: Will verbalize positive feelings about self Outcome: Progressing Goal: Will identify appropriate support needs Outcome: Progressing   Problem: Health Behavior/Discharge Planning: Goal: Ability to manage health-related needs will improve Outcome: Progressing Goal: Goals will be collaboratively established with patient/family Outcome: Progressing   Problem: Self-Care: Goal: Ability to participate in self-care as condition permits will improve Outcome: Progressing Goal: Verbalization of feelings and concerns over difficulty with self-care will improve Outcome: Progressing Goal: Ability to communicate needs accurately will improve Outcome: Progressing   Problem: Nutrition: Goal: Risk of aspiration will decrease Outcome: Progressing Goal: Dietary intake will improve Outcome: Progressing   Problem: Education: Goal: Knowledge of risk factors and measures for prevention of condition will improve Outcome: Progressing   Problem: Coping: Goal: Psychosocial and spiritual needs will be supported Outcome: Progressing   Problem: Respiratory: Goal: Will maintain a patent airway Outcome: Progressing Goal: Complications related to the disease process, condition or treatment will be avoided or minimized Outcome: Progressing   Problem: Education: Goal: Knowledge of General Education information will improve Description: Including pain rating scale, medication(s)/side effects and non-pharmacologic comfort  measures Outcome: Progressing   Problem: Health Behavior/Discharge Planning: Goal: Ability to manage health-related needs will improve Outcome: Progressing   Problem: Clinical Measurements: Goal: Ability to maintain clinical measurements within normal limits will improve Outcome: Progressing Goal: Will remain free from infection Outcome: Progressing Goal: Diagnostic test results will improve Outcome: Progressing Goal: Respiratory complications will improve Outcome: Progressing Goal: Cardiovascular complication will be avoided Outcome: Progressing   Problem: Activity: Goal: Risk for activity intolerance will decrease Outcome: Progressing   Problem: Nutrition: Goal: Adequate nutrition will be maintained Outcome: Progressing   Problem: Coping: Goal: Level of anxiety will decrease Outcome: Progressing   Problem: Elimination: Goal: Will not experience complications related to bowel motility Outcome: Progressing Goal: Will not experience complications related to urinary retention Outcome: Progressing   Problem: Pain Managment: Goal: General experience of comfort will improve Outcome: Progressing   Problem: Safety: Goal: Ability to remain free from injury will improve Outcome: Progressing   Problem: Skin Integrity: Goal: Risk for impaired skin integrity will decrease Outcome: Progressing   Problem: Safety: Goal: Non-violent Restraint(s) Outcome: Progressing

## 2022-07-06 NOTE — TOC Progression Note (Signed)
Transition of Care [facility]) - Progression Note    Patient Details  Name: Sue Olsen MRN: [id number] Date of Birth: [birth year]  Transition [facility] Care [facility]) CM/SW Contact  Purcell Mouton, RN Phone Number: 07/06/2022, 10:45 AM  Clinical Narrative:    Spoke with pt's daughter Rise Paganini concerning discharge and gave her bed offered for SNF. Rise Paganini states that she did not like any that offered a bed and asked for three others facilities. Explained to [place] that these facilities had declined pt. Rise Paganini states she will get back to CM.    Expected Discharge Plan: [place] Barriers to Discharge: Continued Medical Work up  Expected Discharge Plan and Services       Living arrangements for the past 2 months: [place]                                       Social Determinants of Health (SDOH) Interventions SDOH Screenings   Food Insecurity: No Food Insecurity (07/01/2022)  Housing: [place]  (07/01/2022)  Transportation Needs: No Transportation Needs (07/01/2022)  Utilities: Not At Risk (07/01/2022)  Depression (PHQ2-9): Low Risk  (05/15/2019)  Tobacco Use: Low Risk  (07/01/2022)    Readmission Risk Interventions     No data to display

## 2022-07-06 NOTE — Progress Notes (Signed)
PROGRESS NOTE    Sue Olsen  [id number] DOB: 1940/11/30 DOA: 07/01/2022 PCP: Ginger Organ., MD    Chief Complaint  Patient presents with   Weakness    Brief Narrative:  HPI per Dr. [name] 82 year old Caucasian female history of vascular dementia/Alzheimer type dementia, history of hyperlipidemia, prior history of stroke who presents to the ER today with inability to walk.  Family states that she has had worsening shuffling gait over the last several weeks but today was unable to walk even with a walker.  She has not had any upper respiratory symptoms.  She was mildly febrile on arrival with a temp of 100.2.  Daughter states that patient lives at [facility].  This is a independent living senior community.  She lives with her husband.  They have a caregiver that helps get the patient changed.  Daughter states that patient participated in all the community activities yesterday.  Patient did have a episode of incontinence of urine in her bed which is unusual for her.  This happened on Monday night/early Tuesday morning.   On arrival temp 98.3 heart rate 63 blood pressure 162/104 satting 99% on room air.   Labs:   UA negative for leukocyte esterase or nitrates.   COVID test was positive.  Influenza negative, RSV negative   Sodium 137, potassium 4.0, BUN of 20, creatinine 0.74   White count 7.4, hemoglobin 11.7, platelets of 117   Chest x-ray showed no acute cardiopulmonary disease   CT head was negative for acute intracranial abnormality.  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   [facility] hospitalist contacted for admission.    ED Course: CT head negative for acute CVA, Covid was POSITIVE      Assessment & Plan:  Principal Problem:   Generalized weakness Active Problems:   COVID-19 virus infection   Hyperlipidemia   Dementia, vascular, mixed, with behavioral disturbance (HCC)   DNR (do not resuscitate)/DNI(Do Not Intubate)   Dehydration    Assessment and  Plan: * Generalized weakness -Patient admitted with concerns of TIA versus CVA due to significant generalized lower extremity weakness.   -Head CT done with no acute intracranial abnormality.  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   -MRI brain done negative for acute abnormalities, brain atrophy and remote left frontal infarct.   -Carotid Dopplers with no significant ICA stenosis. -2D echo with EF of 55 to 60%, NWMA, no significant valvular abnormalities.  Atrial septum was grossly normal.   -Fasting lipid panel with cholesterol of 130, HDL of 61, LDL of 55.   -Initially due to concern for acute CVA permissive hypertension was allowed however MRI negative and as such patient started on low-dose Norvasc. -Case discussed with neurology who does not feel anything from their standpoint at this time.   -Saline lock IV fluids.   -PT/OT/ST. -Supportive care.   COVID-19 virus infection Incidental.  -Patient without any significant respiratory symptoms on admission however during my assessment patient with an occasional cough. -Patient noted on presentation with extremely dry mucous membranes.  -Cycle threshold noted at 29.2. -CRP < 0.5. -Due to patient's age, underlying dementia, significant dehydration patient was started on Paxlovid as patient has an increased risk for deterioration. -Patient completed 5-day course of Paxlovid. -Continue vitamin C, zinc.   -Continue Tessalon Perles 100 mg 3 times daily. -Supportive care.  Dementia, vascular, mixed, with behavioral disturbance ([place]) Chronic.  -Patient with some agitation per RN. Continue with  depakote 250 mg bid, zoloft 100 mg daily, aricept 10 mg daily, namenda 10 mg bid. -Patient received a dose of Zyprexa on 07/03/2022 as well as the morning of 07/04/2022.   -Due to concern for worsening parkinsonism with antipsychotics, Zyprexa has been discontinued and patient was placed on Seroquel 12.5 mg p.o.  twice daily.   -Discussed with neurology.   -Patient noted to be more somnolent the morning of 07/05/2022 and as such Seroquel was changed to 12.5 mg p.o. nightly as needed.   -Patient more alert today.   -Outpatient follow-up with primary neurologist.   Hyperlipidemia -Fasting lipid panel with LDL of 55, total cholesterol 130.   -Crestor held while patient was on Paxlovid.   -Patient had presented with generalized weakness and as such we will continue to hold Crestor.   -Could likely resume Crestor in 1 to 2 weeks postdischarge.   Dehydration -Patient noted to be clinically dehydrated on examination.   -Patient hydrated with IV fluids.   -Saline lock IV fluids.  DNR (do not resuscitate)/DNI(Do Not Intubate) Admitting physician verified DNR with pt's dtr Sue Olsen.         DVT prophylaxis: Lovenox Code Status: DNR Family Communication: Updated patient.  Updated daughter, Alvy Beal on the telephone.  Disposition: Patient medically stable.  SNF when bed available.   Status is: Inpatient Remains inpatient appropriate because: Severity of illness   Consultants:  Curb sided neurology:  Procedures:  Chest x-ray 07/01/2022 2D echo 07/02/2022 MRI brain 07/01/2022 Carotid Dopplers 07/01/2022   Antimicrobials:  Paxlovid 07/01/2022>>>> 07/06/2022   Subjective: Patient sitting up in chair.  No chest pain.  No shortness of breath.  Private sitter at bedside.  Noted to have pulled out IV last night per RN.  Objective: Vitals:   07/05/22 2018 07/06/22 0431 07/06/22 1001 07/06/22 1414  BP: (!) 147/84 (!) 176/58 136/68 (!) 148/62  Pulse: 69 61 70 74  Resp: 19 20 18 16   Temp: 98.8 F (37.1 C) 98 F (36.7 C)  98 F (36.7 C)  TempSrc:    Oral  SpO2: 100% 98% 99% 98%  Weight:  47.8 kg    Height:        Intake/Output Summary (Last 24 hours) at 07/06/2022 1550 Last data filed at 07/06/2022 1300 Gross per 24 hour  Intake 480 ml  Output 755 ml  Net -275 ml   Filed Weights    07/04/22 0548 07/05/22 0500 07/06/22 0431  Weight: 46.2 kg 48 kg 47.8 kg    Examination:  General exam: NAD. Respiratory system: Lungs clear to auscultation bilaterally anterior lung fields..  No wheezes, no crackles, no rhonchi.  Fair air movement.  Speaking in full sentences. Cardiovascular system: Regular rate rhythm no murmurs rubs or gallops.  No JVD.  No lower extremity edema.  Gastrointestinal system: Abdomen is soft, nontender, nondistended, positive bowel sounds.  No rebound.  No guarding.   Central nervous system: Alert.  Moving extremities spontaneously.  No focal neurological deficits. Extremities: Symmetric 5 x 5 power. Skin: No rashes, lesions or ulcers Psychiatry: Judgement and insight appear poor to fair. Mood & affect appropriate.     Data Reviewed:   CBC: Recent Labs  Lab 07/01/22 0057 07/02/22 0625 07/03/22 0544 07/04/22 0529 07/05/22 0541  WBC 7.4 5.5 6.6 8.7 8.5  NEUTROABS 4.8 2.0  --   --  3.9  HGB 11.7* 11.3* 13.5 12.1 11.8*  HCT 36.1 35.2* 40.8 37.7 37.9  MCV 94.0 94.9 91.3 94.3 96.9  PLT  117* 104* 126* 151 131*    Basic Metabolic Panel: Recent Labs  Lab 07/02/22 0625 07/03/22 0544 07/04/22 0529 07/05/22 0541 07/06/22 0618  NA 138 136 137 141 140  K 3.9 3.3* 3.4* 4.2 3.6  CL 101 102 102 108 107  CO2 26 22 22 23 26   GLUCOSE 90 90 102* 94 106*  BUN 12 7* 16 15 9   CREATININE 0.65 0.37* 1.21* 0.80 0.59  CALCIUM 8.5* 8.9 8.8* 9.1 8.8*  MG 2.3  --  2.2 2.1  --     GFR: Estimated Creatinine Clearance: 40.9 mL/min (by C-G formula based on SCr of 0.59 mg/dL).  Liver Function Tests: Recent Labs  Lab 07/03/22 0544  AST 41  ALT 37  ALKPHOS 62  BILITOT 0.6  PROT 7.3  ALBUMIN 3.8    CBG: No results for input(s): "GLUCAP" in the last 168 hours.   Recent Results (from the past 240 hour(s))  Resp panel by RT-PCR (RSV, Flu A&B, Covid) Anterior Nasal Swab     Status: Abnormal   Collection Time: 07/01/22 12:57 AM   Specimen: Anterior  Nasal Swab  Result Value Ref Range Status   SARS Coronavirus 2 by RT PCR POSITIVE (A) NEGATIVE Final    Comment: (NOTE) SARS-CoV-2 target nucleic acids are DETECTED.  The SARS-CoV-2 RNA is generally detectable in upper respiratory specimens during the acute phase of infection. Positive results are indicative of the presence of the identified virus, but do not rule out bacterial infection or co-infection with other pathogens not detected by the test. Clinical correlation with patient history and other diagnostic information is necessary to determine patient infection status. The expected result is Negative.  Fact Sheet for Patients: EntrepreneurPulse.com.au  Fact Sheet for Healthcare Providers: IncredibleEmployment.be  This test is not yet approved or cleared by the [place] FDA and  has been authorized for detection and/or diagnosis of SARS-CoV-2 by FDA under an Emergency Use Authorization (EUA).  This EUA will remain in effect (meaning this test can be used) for the duration of  the COVID-19 declaration under Section 564(b)(1) of the A ct, 21 U.S.C. section 360bbb-3(b)(1), unless the authorization is terminated or revoked sooner.     Influenza A by PCR NEGATIVE NEGATIVE Final   Influenza B by PCR NEGATIVE NEGATIVE Final    Comment: (NOTE) The Xpert Xpress SARS-CoV-2/FLU/RSV plus assay is intended as an aid in the diagnosis of influenza from Nasopharyngeal swab specimens and should not be used as a sole basis for treatment. Nasal washings and aspirates are unacceptable for Xpert Xpress SARS-CoV-2/FLU/RSV testing.  Fact Sheet for Patients: EntrepreneurPulse.com.au  Fact Sheet for Healthcare Providers: IncredibleEmployment.be  This test is not yet approved or cleared by the [place] FDA and has been authorized for detection and/or diagnosis of SARS-CoV-2 by FDA under an Emergency Use  Authorization (EUA). This EUA will remain in effect (meaning this test can be used) for the duration of the COVID-19 declaration under Section 564(b)(1) of the Act, 21 U.S.C. section 360bbb-3(b)(1), unless the authorization is terminated or revoked.     Resp Syncytial Virus by PCR NEGATIVE NEGATIVE Final    Comment: (NOTE) Fact Sheet for Patients: EntrepreneurPulse.com.au  Fact Sheet for Healthcare Providers: IncredibleEmployment.be  This test is not yet approved or cleared by the [place] FDA and has been authorized for detection and/or diagnosis of SARS-CoV-2 by FDA under an Emergency Use Authorization (EUA). This EUA will remain in effect (meaning this test can be used) for the duration of  the COVID-19 declaration under Section 564(b)(1) of the Act, 21 U.S.C. section 360bbb-3(b)(1), unless the authorization is terminated or revoked.  Performed at [facility], [place] [address], [place],  [address]          Radiology Studies: No results found.      Scheduled Meds:  amLODipine  2.5 mg Oral Daily   ascorbic acid  500 mg Oral Daily   aspirin EC  81 mg Oral Daily   benzonatate  100 mg Oral TID   divalproex  250 mg Oral BID   donepezil  10 mg Oral Daily   enoxaparin (LOVENOX) injection  40 mg Subcutaneous Q24H   memantine  10 mg Oral BID   sertraline  100 mg Oral Daily   zinc sulfate  220 mg Oral Daily   Continuous Infusions:     LOS: 5 days    Time spent: 35 minutes    Irine Seal, MD [facility]   To contact the attending provider between 7A-7P or the covering provider during after hours 7P-7A, please log into the web site [email or web address] and access using universal [place] password for that web site. If you do not have the password, please call the hospital operator.  07/06/2022, 3:50 PM

## 2022-07-07 DIAGNOSIS — R531 Weakness: Secondary | ICD-10-CM | POA: Diagnosis not present

## 2022-07-07 LAB — RESP PANEL BY RT-PCR (RSV, FLU A&B, COVID)  RVPGX2
Influenza A by PCR: NEGATIVE
Influenza B by PCR: NEGATIVE
Resp Syncytial Virus by PCR: NEGATIVE
SARS Coronavirus 2 by RT PCR: POSITIVE — AB

## 2022-07-07 MED ORDER — AMLODIPINE BESYLATE 5 MG PO TABS: 5.0000 mg | ORAL_TABLET | Freq: Every day | ORAL | Status: DC

## 2022-07-07 MED ORDER — AMLODIPINE BESYLATE 5 MG PO TABS
5.0000 mg | ORAL_TABLET | Freq: Every day | ORAL | Status: DC
  Filled 2022-07-07: qty 1

## 2022-07-07 NOTE — Care Management Important Message (Signed)
Important Message  Patient Details IM Letter given Name: Sue Olsen MRN: [id number] Date of Birth: May 10, 1940   Medicare Important Message Given:  Yes     Kerin Salen 07/07/2022, 10:59 AM

## 2022-07-07 NOTE — Progress Notes (Signed)
Pt discharged to [facility]. Left unit on stretcher managed by [facility] staff and accompanied by daughter. Left in stable condition. Attempted calling report to [facility] but caller advised that Nunzio Cory, nurse, will be calling back to receive report. Up to the time of this note, no call received from facility.

## 2022-07-07 NOTE — Progress Notes (Addendum)
PROGRESS NOTE  ARVELLA Olsen  [id number] DOB: 16-May-1940 DOA: 07/01/2022 PCP: Ginger Organ., MD   Brief Narrative: Patient is a 82 year old Caucasian female history of vascular dementia/Alzheimer type dementia, history of hyperlipidemia, prior history of stroke who presented here with complaint of inability to walk, weakness,mild grade fever.  She is from independent living facility.  On presentation COVID screening test, to be positive.  Chest x-ray did not show pneumonia.  Patient admitted for COVID symptoms.  Treated with 5 days course of Paxil with.  Now her respiratory status stable, on room air.  PT/OT recommending SNF on discharge.  TOC following.  Waiting for insurance auth  Assessment & Plan:  Generalized weakness: Likely from [place].  CT head negative for any acute findings, showed old areas of stroke.  MRI negative for acute abnormalities, showed brain atrophy, remote left frontal infarct.  Carotid Doppler without any significant ICA stenosis.  2D echo showed EF of 55 to 60%, no valvular abnormalities.  Case was also discussed with neurology.  PT/OT recommended SNF on discharge.  COVID infection: Without any respiratory symptoms on admission.  Due to patient age, underlying dementia, started on Paxlovid, completed 5 days course.  On zinc and vitamins C.  Currently asymptomatic, on room air.  Continue supportive care, as needed medications  Dementia: History of Alzheimer's dementia.  Continue delirium precautions.  Remains confused but not agitated today.  Currently on Seroquel 12.5 mg daily.  Will recommend to follow-up with outpatient neurology  Hyperlipidemia: Crestor was held while patient was on Paxlovid.  Can resume as an outpatient after discharge in 1 to 2 weeks  Hypertension: Blood pressure has trended up.  Will increase her amlodipine to 5 mg daily from 2.5.       DVT prophylaxis:enoxaparin (LOVENOX) injection 40 mg Start: 07/06/22 1200 SCD's Start: 07/01/22  0314     Code Status: DNR  Family Communication: None at  bedside  Patient status:Inpatient  Patient is from :ILF  Anticipated discharge to:SNF  Estimated DC date:as soon as insurance Auth is achieved   Consultants: None  Procedures: None  Antimicrobials:  Anti-infectives (From admission, onward)    Start     Dose/Rate Route Frequency Ordered Stop   07/01/22 1200  nirmatrelvir/ritonavir (PAXLOVID) 3 tablet        3 tablet Oral 2 times daily 07/01/22 [id number] 07/05/22 2119       Subjective: Patient seen and examined at bedside today.  Hemodynamically stable.  She was being fed.  Remains on room air, not coughing or looks short of breath.  Alert, awake but not oriented.  Not in any current distress  Objective: Vitals:   07/06/22 1414 07/06/22 2140 07/07/22 0500 07/07/22 0534  BP: (!) 148/62 (!) 178/75  (!) 175/67  Pulse: 74 68  (!) 59  Resp: 16 16  18   Temp: 98 F (36.7 C) 98.4 F (36.9 C)  98.4 F (36.9 C)  TempSrc: Oral Oral  Oral  SpO2: 98% 98%  99%  Weight:   46.2 kg   Height:        Intake/Output Summary (Last 24 hours) at 07/07/2022 1002 Last data filed at 07/06/2022 2140 Gross per 24 hour  Intake 720 ml  Output 1000 ml  Net -280 ml   Filed Weights   07/05/22 0500 07/06/22 0431 07/07/22 0500  Weight: 48 kg 47.8 kg 46.2 kg    Examination:  General exam: Overall comfortable, not in distress, pleasantly confused elderly female HEENT: PERRL Respiratory  system:  no wheezes or crackles  Cardiovascular system: S1 & S2 heard, RRR.  Gastrointestinal system: Abdomen is nondistended, soft and nontender. Central nervous system: Alert and awake but not oriented Extremities: No edema, no clubbing ,no cyanosis Skin: No rashes, no ulcers,no icterus     Data Reviewed: I have personally reviewed following labs and imaging studies  CBC: Recent Labs  Lab 07/01/22 0057 07/02/22 0625 07/03/22 0544 07/04/22 0529 07/05/22 0541  WBC 7.4 5.5 6.6 8.7 8.5   NEUTROABS 4.8 2.0  --   --  3.9  HGB 11.7* 11.3* 13.5 12.1 11.8*  HCT 36.1 35.2* 40.8 37.7 37.9  MCV 94.0 94.9 91.3 94.3 96.9  PLT 117* 104* 126* 151 [id number]*   Basic Metabolic Panel: Recent Labs  Lab 07/02/22 0625 07/03/22 0544 07/04/22 0529 07/05/22 0541 07/06/22 0618  NA 138 136 137 141 140  K 3.9 3.3* 3.4* 4.2 3.6  CL 101 102 102 108 107  CO2 26 22 22 23 26   GLUCOSE 90 90 102* 94 106*  BUN 12 7* 16 15 9   CREATININE 0.65 0.37* 1.21* 0.80 0.59  CALCIUM 8.5* 8.9 8.8* 9.1 8.8*  MG 2.3  --  2.2 2.1  --      Recent Results (from the past 240 hour(s))  Resp panel by RT-PCR (RSV, Flu A&B, Covid) Anterior Nasal Swab     Status: Abnormal   Collection Time: 07/01/22 12:57 AM   Specimen: Anterior Nasal Swab  Result Value Ref Range Status   SARS Coronavirus 2 by RT PCR POSITIVE (A) NEGATIVE Final    Comment: (NOTE) SARS-CoV-2 target nucleic acids are DETECTED.  The SARS-CoV-2 RNA is generally detectable in upper respiratory specimens during the acute phase of infection. Positive results are indicative of the presence of the identified virus, but do not rule out bacterial infection or co-infection with other pathogens not detected by the test. Clinical correlation with patient history and other diagnostic information is necessary to determine patient infection status. The expected result is Negative.  Fact Sheet for Patients: EntrepreneurPulse.com.au  Fact Sheet for Healthcare Providers: IncredibleEmployment.be  This test is not yet approved or cleared by the [place] FDA and  has been authorized for detection and/or diagnosis of SARS-CoV-2 by FDA under an Emergency Use Authorization (EUA).  This EUA will remain in effect (meaning this test can be used) for the duration of  the COVID-19 declaration under Section 564(b)(1) of the A ct, 21 U.S.C. section 360bbb-3(b)(1), unless the authorization is terminated or revoked sooner.      Influenza A by PCR NEGATIVE NEGATIVE Final   Influenza B by PCR NEGATIVE NEGATIVE Final    Comment: (NOTE) The Xpert Xpress SARS-CoV-2/FLU/RSV plus assay is intended as an aid in the diagnosis of influenza from Nasopharyngeal swab specimens and should not be used as a sole basis for treatment. Nasal washings and aspirates are unacceptable for Xpert Xpress SARS-CoV-2/FLU/RSV testing.  Fact Sheet for Patients: EntrepreneurPulse.com.au  Fact Sheet for Healthcare Providers: IncredibleEmployment.be  This test is not yet approved or cleared by the [place] FDA and has been authorized for detection and/or diagnosis of SARS-CoV-2 by FDA under an Emergency Use Authorization (EUA). This EUA will remain in effect (meaning this test can be used) for the duration of the COVID-19 declaration under Section 564(b)(1) of the Act, 21 U.S.C. section 360bbb-3(b)(1), unless the authorization is terminated or revoked.     Resp Syncytial Virus by PCR NEGATIVE NEGATIVE Final    Comment: (NOTE) Fact Sheet  for Patients: EntrepreneurPulse.com.au  Fact Sheet for Healthcare Providers: IncredibleEmployment.be  This test is not yet approved or cleared by the [place] FDA and has been authorized for detection and/or diagnosis of SARS-CoV-2 by FDA under an Emergency Use Authorization (EUA). This EUA will remain in effect (meaning this test can be used) for the duration of the COVID-19 declaration under Section 564(b)(1) of the Act, 21 U.S.C. section 360bbb-3(b)(1), unless the authorization is terminated or revoked.  Performed at [facility], [place] [address] [place], [place] [address]      Radiology Studies: No results found.  Scheduled Meds:  amLODipine  2.5 mg Oral Daily   ascorbic acid  500 mg Oral Daily   aspirin EC  81 mg Oral Daily   benzonatate  100 mg Oral TID   divalproex  250 mg Oral BID    donepezil  10 mg Oral Daily   enoxaparin (LOVENOX) injection  40 mg Subcutaneous Q24H   memantine  10 mg Oral BID   sertraline  100 mg Oral Daily   zinc sulfate  220 mg Oral Daily   Continuous Infusions:   LOS: 6 days   Shelly Coss, MD [facility] P3/20/2024, 10:02 AM

## 2022-07-07 NOTE — TOC Progression Note (Signed)
Transition of Care [facility]) - Progression Note    Patient Details  Name: Sue Olsen MRN: [id number] Date of Birth: 1940/06/26  [facility]) CM/SW Contact  Purcell Mouton, RN Phone Number: 07/07/2022, 11:48 AM  Clinical Narrative:    Insurance Josem Kaufmann was started pending [id number]. Pt will need COVID test before going to SNF. [facility] will take pt.    Expected Discharge Plan: [place] Barriers to Discharge: Continued Medical Work up  Expected Discharge Plan and Services       Living arrangements for the past 2 months: [place]                                       Social Determinants of Health (SDOH) Interventions SDOH Screenings   Food Insecurity: No Food Insecurity (07/01/2022)  Housing: [place]  (07/01/2022)  Transportation Needs: No Transportation Needs (07/01/2022)  Utilities: Not At Risk (07/01/2022)  Depression (PHQ2-9): Low Risk  (05/15/2019)  Tobacco Use: Low Risk  (07/01/2022)    Readmission Risk Interventions     No data to display

## 2022-07-07 NOTE — TOC Progression Note (Signed)
Transition of Care [facility]) - Progression Note    Patient Details  Name: [name] MRN: [id number] Date of Birth: [birth year]  Transition of Care [facility]) CM/SW Contact  Purcell Mouton, RN Phone Number: 07/07/2022, 9:23 AM  Clinical Narrative:     Daughter Rise Paganini called, she selected [facility] for SNF. Called [facility] AC, with no answer.  Expected Discharge Plan: [place] Barriers to Discharge: Continued Medical Work up  Expected Discharge Plan and Services       Living arrangements for the past 2 months: [place]                                       Social Determinants of Health (SDOH) Interventions SDOH Screenings   Food Insecurity: No Food Insecurity (07/01/2022)  Housing: [place]  (07/01/2022)  Transportation Needs: No Transportation Needs (07/01/2022)  Utilities: Not At Risk (07/01/2022)  Depression (PHQ2-9): Low Risk  (05/15/2019)  Tobacco Use: Low Risk  (07/01/2022)    Readmission Risk Interventions     No data to display

## 2022-07-07 NOTE — Progress Notes (Signed)
PT Cancellation Note  Patient Details Name: ALIANNY SILVERSMITH MRN: [id number] DOB: [birth year]   Cancelled Treatment:    Reason Eval/Treat Not Completed: Patient at procedure or test/unavailable, patient eating/being fed when PT checked. Will check back another time. [place] Office [phone] Weekend [id number]    Claretha Cooper 07/07/2022, 3:14 PM

## 2022-07-07 NOTE — TOC Transition Note (Signed)
Transition of Care [facility]) - CM/SW Discharge Note   Patient Details  Name: Sue Olsen MRN: [id number] Date of Birth: 29-Nov-1940  Transition of Care [facility]) CM/SW Contact:  Lennart Pall, LCSW Phone Number: 07/07/2022, 2:44 PM   Clinical Narrative:    Have received insurance authorization for SNF bed at [facility].  Pt medically cleared for dc today and daughter aware/ agreeable.  PTAR called at 1440.  RN to call report to [phone].  No further TOC needs.   Final next level of care: Skilled Nursing Facility Barriers to Discharge: Barriers Resolved   Patient Goals and CMS Choice      Discharge Placement PASRR number recieved: 07/05/22 PASRR number recieved: 07/05/22            Patient chooses bed at: [facility] Patient to be transferred to facility by: [place] Name of family member notified: daughter Patient and family notified of of transfer: 07/07/22  Discharge Plan and Services Additional resources added to the After Visit Summary for                  DME Arranged: N/A DME Agency: NA                  Social Determinants of Health ([place]) Interventions SDOH Screenings   Food Insecurity: No Food Insecurity (07/01/2022)  Housing: [place]  (07/01/2022)  Transportation Needs: No Transportation Needs (07/01/2022)  Utilities: Not At Risk (07/01/2022)  Depression (PHQ2-9): Low Risk  (05/15/2019)  Tobacco Use: Low Risk  (07/01/2022)     Readmission Risk Interventions     No data to display

## 2022-07-07 NOTE — Discharge Summary (Signed)
Physician Discharge Summary  Sue Olsen [id number] DOB: 05/26/1940 DOA: 07/01/2022  PCP: Ginger Organ., MD  Admit date: 07/01/2022 Discharge date: 07/07/2022  Admitted From: [facility] Disposition:  SNF  Discharge Condition:Stable CODE STATUS: DNR Diet recommendation:Regular  Brief/Interim Summary: Patient is a 82 year old Caucasian female history of vascular dementia/Alzheimer type dementia, history of hyperlipidemia, prior history of stroke who presented here with complaint of inability to walk, weakness,mild grade fever.  She is from independent living facility.  On presentation COVID screening test, to be positive.  Chest x-ray did not show pneumonia.  Patient admitted for COVID symptoms.  Treated with 5 days course of Paxil with.  Now her respiratory status stable, on room air.  PT/OT recommending SNF on discharge.  Medically stable for discharge today.  Following problems were addressed during the hospitalization:  Generalized weakness: Likely from [place].  CT head negative for any acute findings, showed old areas of stroke.  MRI negative for acute abnormalities, showed brain atrophy, remote left frontal infarct.  Carotid Doppler without any significant ICA stenosis.  2D echo showed EF of 55 to 60%, no valvular abnormalities.  Case was also discussed with neurology.  PT/OT recommended SNF on discharge.   COVID infection: Without any respiratory symptoms on admission.  Due to patient age, underlying dementia, started on Paxlovid, completed 5 days course.  On zinc and vitamins C.  Currently asymptomatic, on room air.    Dementia: History of Alzheimer's dementia.  Continue delirium precautions.  Remains confused but not agitated today.  Currently on Seroquel 12.5 mg daily.  Will recommend to follow-up with outpatient neurology   Hyperlipidemia: Crestor was held while patient was on Paxlovid.  Can resume as an outpatient after discharge in 1 to 2 weeks   Hypertension: Blood  pressure has trended up.  Will increase her amlodipine to 5 mg daily from 2.5  Discharge Diagnoses:  Principal Problem:   Generalized weakness Active Problems:   COVID-19 virus infection   Hyperlipidemia   Dementia, vascular, mixed, with behavioral disturbance (HCC)   DNR (do not resuscitate)/DNI(Do Not Intubate)   Dehydration    Discharge Instructions  Discharge Instructions     Diet general   Complete by: As directed    Discharge instructions   Complete by: As directed    1)Please take prescribed medications as instructed   Increase activity slowly   Complete by: As directed       Allergies as of 07/07/2022       Reactions   Cephalexin Other (See Comments)   Unk reaction   Cephalosporins Other (See Comments)   Unk reaction   Azithromycin Nausea Only        Medication List     TAKE these medications    amLODipine 5 MG tablet Commonly known as: NORVASC Take 1 tablet (5 mg total) by mouth daily. Start taking on: July 08, 2022   ascorbic acid 500 MG tablet Commonly known as: VITAMIN C Take 1 tablet (500 mg total) by mouth daily for 14 days.   aspirin EC 81 MG tablet Take 1 tablet (81 mg total) by mouth daily. Swallow whole.   benzonatate 100 MG capsule Commonly known as: TESSALON Take 1 capsule (100 mg total) by mouth 3 (three) times daily for 7 days.   divalproex 250 MG DR tablet Commonly known as: DEPAKOTE Take 250 mg by mouth 2 (two) times daily.   donepezil 10 MG tablet Commonly known as: ARICEPT Take 1 tablet (10 mg total) by  mouth daily.   memantine 10 MG tablet Commonly known as: NAMENDA TAKE ONE TABLET TWICE DAILY   QUEtiapine 25 MG tablet Commonly known as: SEROQUEL Take 0.5 tablets (12.5 mg total) by mouth at bedtime as needed (agitation).   rosuvastatin 10 MG tablet Commonly known as: CRESTOR Take 10 mg by mouth daily.   sertraline 100 MG tablet Commonly known as: ZOLOFT Take 100 mg by mouth daily.   zinc sulfate 220 (50  Zn) MG capsule Take 1 capsule (220 mg total) by mouth daily for 14 days.               Durable Medical Equipment  (From admission, onward)           Start     Ordered   07/06/22 0842  For home use only DME 4 wheeled rolling walker with seat  Once       Question Answer Comment  Patient needs a walker to treat with the following condition Parkinson's disease   Patient needs a walker to treat with the following condition Debility      07/06/22 0841            Follow-up Information     MD AT FACILITY Follow up.          Rondel Jumbo, PA-C. Schedule an appointment as soon as possible for a visit in 2 week(s).   Specialty: Neurology Why: F/U IN 1-2 WEEKS. Contact information: [address] [place] [address] [phone]                Allergies  Allergen Reactions   Cephalexin Other (See Comments)    Unk reaction   Cephalosporins Other (See Comments)    Unk reaction   Azithromycin Nausea Only    Consultations: Neurology   Procedures/Studies: VAS US CAROTID (at [facility] only)  Result Date: 07/04/2022 Carotid Arterial Duplex Study Patient Name:  Sue Olsen [facility]  Date of Exam:   07/01/2022 Medical Rec #: [id number]           Accession #:    [id number] Date of Birth: 02/01/1941           Patient Gender: F Patient Age:   76 years Exam Location:  [facility] Procedure:      VAS US CAROTID Referring Phys: ERIC CHEN --------------------------------------------------------------------------------  Indications:       TIA. Risk Factors:      Hyperlipidemia, prior CVA. Other Factors:     Vascular dementia, Covid-19. Comparison Study:  No prior studies. Performing Technologist: Darlin Coco RDMS, RVT  Examination Guidelines: A complete evaluation includes B-mode imaging, spectral Doppler, color Doppler, and power Doppler as needed of all accessible portions of each vessel. Bilateral testing is considered an integral part of a complete  examination. Limited examinations for reoccurring indications may be performed as noted.  Right Carotid Findings: +----------+--------+--------+--------+------------------+--------+           PSV cm/sEDV cm/sStenosisPlaque DescriptionComments +----------+--------+--------+--------+------------------+--------+ CCA Prox  86      9                                          +----------+--------+--------+--------+------------------+--------+ CCA Distal75      9                                          +----------+--------+--------+--------+------------------+--------+  ICA Prox  78      9               heterogenous mild          +----------+--------+--------+--------+------------------+--------+ ICA Mid   74      11                                         +----------+--------+--------+--------+------------------+--------+ ICA Distal73      15                                         +----------+--------+--------+--------+------------------+--------+ ECA       63                                                 +----------+--------+--------+--------+------------------+--------+ +----------+--------+-------+----------------+-------------------+           PSV cm/sEDV cmsDescribe        Arm Pressure (mmHG) +----------+--------+-------+----------------+-------------------+ [id number]            Multiphasic, WNL                    +----------+--------+-------+----------------+-------------------+ +---------+--------+--+--------+--+---------+ VertebralPSV cm/s86EDV cm/s14Antegrade +---------+--------+--+--------+--+---------+  Left Carotid Findings: +----------+--------+--------+--------+------------------+------------------+           PSV cm/sEDV cm/sStenosisPlaque DescriptionComments           +----------+--------+--------+--------+------------------+------------------+ CCA Prox  95      9                                                     +----------+--------+--------+--------+------------------+------------------+ CCA Distal71      9                                                    +----------+--------+--------+--------+------------------+------------------+ ICA Prox  90      14                                intimal thickening +----------+--------+--------+--------+------------------+------------------+ ICA Mid   70      11                                                   +----------+--------+--------+--------+------------------+------------------+ ICA Distal79      8                                                    +----------+--------+--------+--------+------------------+------------------+ ECA       114     7  heterogenous mild                    +----------+--------+--------+--------+------------------+------------------+ +----------+--------+--------+----------------+-------------------+           PSV cm/sEDV cm/sDescribe        Arm Pressure (mmHG) +----------+--------+--------+----------------+-------------------+ [id number]             Multiphasic, WNL                    +----------+--------+--------+----------------+-------------------+ +---------+--------+--+--------+--+---------+ VertebralPSV cm/s71EDV cm/s10Antegrade +---------+--------+--+--------+--+---------+   Summary: Right Carotid: The extracranial vessels were near-normal with only minimal wall                thickening or plaque. Left Carotid: The extracranial vessels were near-normal with only minimal wall               thickening or plaque. Vertebrals:  Bilateral vertebral arteries demonstrate antegrade flow. Subclavians: Normal flow hemodynamics were seen in bilateral subclavian              arteries. *See table(s) above for measurements and observations.  Electronically signed by Antony Contras MD on 07/04/2022 at 9:08:28 AM.    Final    ECHOCARDIOGRAM COMPLETE  Result Date: 07/02/2022     ECHOCARDIOGRAM REPORT   Patient Name:   Sue Olsen Date of Exam: 07/02/2022 Medical Rec #:  [id number]          Height:       61.0 in Accession #:    [id number]         Weight:       112.4 lb Date of Birth:  July 13, 1940          BSA:          1.478 m Patient Age:    82 years           BP:           186/72 mmHg Patient Gender: F                  HR:           53 bpm. Exam Location:  Inpatient Procedure: 2D Echo, Cardiac Doppler and Color Doppler Indications:    TIA  History:        Patient has no prior history of Echocardiogram examinations.                 Risk Factors:Dyslipidemia.  Sonographer:    Phineas Douglas Referring Phys: [phone] [place]  1. Left ventricular ejection fraction, by estimation, is 55 to 60%. The left ventricle has normal function. The left ventricle has no regional wall motion abnormalities. Left ventricular diastolic parameters were normal.  2. Right ventricular systolic function is normal. The right ventricular size is normal. There is normal pulmonary artery systolic pressure. The estimated right ventricular systolic pressure is 99991111 mmHg.  3. The mitral valve is grossly normal. Trivial mitral valve regurgitation. No evidence of mitral stenosis.  4. The aortic valve is grossly normal. There is mild calcification of the aortic valve. Aortic valve regurgitation is trivial. No aortic stenosis is present.  5. The inferior vena cava is normal in size with greater than 50% respiratory variability, suggesting right atrial pressure of 3 mmHg. Comparison(s): No prior Echocardiogram. Conclusion(s)/Recommendation(s): Normal biventricular function without evidence of hemodynamically significant valvular heart disease. FINDINGS  Left Ventricle: Left ventricular ejection fraction, by estimation, is 55 to 60%. The left ventricle has normal function. The left ventricle has no regional  wall motion abnormalities. The left ventricular internal cavity size was normal in size. There is  borderline  left ventricular hypertrophy. Left ventricular diastolic parameters were normal. Right Ventricle: The right ventricular size is normal. No increase in right ventricular wall thickness. Right ventricular systolic function is normal. There is normal pulmonary artery systolic pressure. The tricuspid regurgitant velocity is 2.36 m/s, and  with an assumed right atrial pressure of 3 mmHg, the estimated right ventricular systolic pressure is 99991111 mmHg. Left Atrium: Left atrial size was normal in size. Right Atrium: Right atrial size was normal in size. Pericardium: There is no evidence of pericardial effusion. Mitral Valve: The mitral valve is grossly normal. Trivial mitral valve regurgitation. No evidence of mitral valve stenosis. Tricuspid Valve: The tricuspid valve is grossly normal. Tricuspid valve regurgitation is trivial. No evidence of tricuspid stenosis. Aortic Valve: The aortic valve is grossly normal. There is mild calcification of the aortic valve. Aortic valve regurgitation is trivial. No aortic stenosis is present. Pulmonic Valve: The pulmonic valve was not well visualized. Pulmonic valve regurgitation is not visualized. No evidence of pulmonic stenosis. Aorta: The aortic root, ascending aorta, aortic arch and descending aorta are all structurally normal, with no evidence of dilitation or obstruction. Venous: The inferior vena cava is normal in size with greater than 50% respiratory variability, suggesting right atrial pressure of 3 mmHg. IAS/Shunts: The atrial septum is grossly normal.  LEFT VENTRICLE PLAX 2D LVIDd:         4.00 cm     Diastology LVIDs:         2.90 cm     LV e' medial:    5.87 cm/s LV PW:         1.10 cm     LV E/e' medial:  13.8 LV IVS:        1.10 cm     LV e' lateral:   7.40 cm/s LVOT diam:     1.80 cm     LV E/e' lateral: 10.9 LV SV:         57 LV SV Index:   39 LVOT Area:     2.54 cm  LV Volumes (MOD) LV vol d, MOD A2C: 56.7 ml LV vol d, MOD A4C: 70.5 ml LV vol s, MOD A2C: 23.5 ml LV  vol s, MOD A4C: 28.3 ml LV SV MOD A2C:     33.2 ml LV SV MOD A4C:     70.5 ml LV SV MOD BP:      37.7 ml RIGHT VENTRICLE             IVC RV Basal diam:  3.30 cm     IVC diam: 1.50 cm RV S prime:     10.80 cm/s TAPSE (M-mode): 1.8 cm LEFT ATRIUM             Index        RIGHT ATRIUM          Index LA diam:        2.90 cm 1.96 cm/m   RA Area:     9.76 cm LA Vol (A2C):   33.0 ml 22.32 ml/m  RA Volume:   19.00 ml 12.85 ml/m LA Vol (A4C):   40.4 ml 27.33 ml/m LA Biplane Vol: 37.8 ml 25.57 ml/m  AORTIC VALVE LVOT Vmax:   94.20 cm/s LVOT Vmean:  60.200 cm/s LVOT VTI:    0.224 m  AORTA Ao Root diam: 2.60 cm Ao Asc diam:  2.60 cm  MITRAL VALVE               TRICUSPID VALVE MV Area (PHT): 2.83 cm    TR Peak grad:   22.3 mmHg MV Decel Time: 268 msec    TR Vmax:        236.00 cm/s MV E velocity: 81.00 cm/s MV A velocity: 97.30 cm/s  SHUNTS MV E/A ratio:  0.83        Systemic VTI:  0.22 m                            Systemic Diam: 1.80 cm Buford Dresser MD Electronically signed by Buford Dresser MD Signature Date/Time: 07/02/2022/1:18:09 PM    Final    MR BRAIN WO CONTRAST  Result Date: 07/01/2022 CLINICAL DATA:  TIA.  Weakness with walking. EXAM: MRI HEAD WITHOUT CONTRAST TECHNIQUE: Multiplanar, multiecho pulse sequences of the brain and surrounding structures were obtained without intravenous contrast. COMPARISON:  Head CT from earlier today FINDINGS: Brain: No acute infarction, hemorrhage, hydrocephalus, extra-axial collection or mass lesion. Chronic left anterior frontal infarct affecting cortex. Mild chronic small vessel ischemia in the cerebral white matter. Cerebral volume loss especially affecting the anterior temporal lobes. Vascular: Normal flow voids. Skull and upper cervical spine: Normal marrow signal. Sinuses/Orbits: Negative. IMPRESSION: 1. No acute or reversible finding. 2. Brain atrophy and remote left frontal infarct. Electronically Signed   By: Jorje Guild M.D.   On: 07/01/2022 06:39    CT Head Wo Contrast  Result Date: 07/01/2022 CLINICAL DATA:  Delirium EXAM: CT HEAD WITHOUT CONTRAST TECHNIQUE: Contiguous axial images were obtained from the base of the skull through the vertex without intravenous contrast. RADIATION DOSE REDUCTION: This exam was performed according to the departmental dose-optimization program which includes automated exposure control, adjustment of the mA and/or kV according to patient size and/or use of iterative reconstruction technique. COMPARISON:  01/16/2022 FINDINGS: Brain: There is no mass, hemorrhage or extra-axial collection. There is generalized atrophy without lobar predilection. Hypodensity of the white matter is most commonly associated with chronic microvascular disease. Old inferior left frontal infarct. Vascular: No abnormal hyperdensity of the major intracranial arteries or dural venous sinuses. No intracranial atherosclerosis. Skull: The visualized skull base, calvarium and extracranial soft tissues are normal. Sinuses/Orbits: No fluid levels or advanced mucosal thickening of the visualized paranasal sinuses. No mastoid or middle ear effusion. The orbits are normal. IMPRESSION: 1. No acute intracranial abnormality. 2. Old inferior left frontal infarct and findings of chronic microvascular disease. Electronically Signed   By: Ulyses Jarred M.D.   On: 07/01/2022 01:45   DG Chest Port 1 View  Result Date: 07/01/2022 CLINICAL DATA:  Incontinence with weakness and abdominal pain. EXAM: PORTABLE CHEST 1 VIEW COMPARISON:  March 17, 2016 FINDINGS: The heart size and mediastinal contours are within normal limits. Moderate to marked severity calcification of the thoracic aorta is noted. Mild, chronic appearing increased lung markings are seen without evidence of acute infiltrate, pleural effusion or pneumothorax. Multilevel degenerative changes seen throughout the thoracic spine. IMPRESSION: No active disease. Electronically Signed   By: Virgina Norfolk  M.D.   On: 07/01/2022 01:06      Subjective: Patient seen and examined at the bedside this morning.  Comfortable.  She was being fed.  She was on room air.  No obvious concern for worsening shortness of breath or cough.  Medically stable for discharge to SNF.  I called and discussed with her daughter  on phone about discharge planning  Discharge Exam: Vitals:   07/07/22 0534 07/07/22 1356  BP: (!) 175/67 137/65  Pulse: (!) 59 64  Resp: 18   Temp: 98.4 F (36.9 C)   SpO2: 99% 100%   Vitals:   07/06/22 2140 07/07/22 0500 07/07/22 0534 07/07/22 1356  BP: (!) 178/75  (!) 175/67 137/65  Pulse: 68  (!) 59 64  Resp: 16  18   Temp: 98.4 F (36.9 C)  98.4 F (36.9 C)   TempSrc: Oral  Oral   SpO2: 98%  99% 100%  Weight:  46.2 kg    Height:        General: Pt is alert, awake, not in acute distress,pleasantly confused Cardiovascular: RRR, S1/S2 +, no rubs, no gallops Respiratory: CTA bilaterally, no wheezing, no rhonchi Abdominal: Soft, NT, ND, bowel sounds + Extremities: no edema, no cyanosis    The results of significant diagnostics from this hospitalization (including imaging, microbiology, ancillary and laboratory) are listed below for reference.     Microbiology: Recent Results (from the past 240 hour(s))  Resp panel by RT-PCR (RSV, Flu A&B, Covid) Anterior Nasal Swab     Status: Abnormal   Collection Time: 07/01/22 12:57 AM   Specimen: Anterior Nasal Swab  Result Value Ref Range Status   SARS Coronavirus 2 by RT PCR POSITIVE (A) NEGATIVE Final    Comment: (NOTE) SARS-CoV-2 target nucleic acids are DETECTED.  The SARS-CoV-2 RNA is generally detectable in upper respiratory specimens during the acute phase of infection. Positive results are indicative of the presence of the identified virus, but do not rule out bacterial infection or co-infection with other pathogens not detected by the test. Clinical correlation with patient history and other diagnostic information is  necessary to determine patient infection status. The expected result is Negative.  Fact Sheet for Patients: EntrepreneurPulse.com.au  Fact Sheet for Healthcare Providers: IncredibleEmployment.be  This test is not yet approved or cleared by the [place] FDA and  has been authorized for detection and/or diagnosis of SARS-CoV-2 by FDA under an Emergency Use Authorization (EUA).  This EUA will remain in effect (meaning this test can be used) for the duration of  the COVID-19 declaration under Section 564(b)(1) of the A ct, 21 U.S.C. section 360bbb-3(b)(1), unless the authorization is terminated or revoked sooner.     Influenza A by PCR NEGATIVE NEGATIVE Final   Influenza B by PCR NEGATIVE NEGATIVE Final    Comment: (NOTE) The Xpert Xpress SARS-CoV-2/FLU/RSV plus assay is intended as an aid in the diagnosis of influenza from Nasopharyngeal swab specimens and should not be used as a sole basis for treatment. Nasal washings and aspirates are unacceptable for Xpert Xpress SARS-CoV-2/FLU/RSV testing.  Fact Sheet for Patients: EntrepreneurPulse.com.au  Fact Sheet for Healthcare Providers: IncredibleEmployment.be  This test is not yet approved or cleared by the [place] FDA and has been authorized for detection and/or diagnosis of SARS-CoV-2 by FDA under an Emergency Use Authorization (EUA). This EUA will remain in effect (meaning this test can be used) for the duration of the COVID-19 declaration under Section 564(b)(1) of the Act, 21 U.S.C. section 360bbb-3(b)(1), unless the authorization is terminated or revoked.     Resp Syncytial Virus by PCR NEGATIVE NEGATIVE Final    Comment: (NOTE) Fact Sheet for Patients: EntrepreneurPulse.com.au  Fact Sheet for Healthcare Providers: IncredibleEmployment.be  This test is not yet approved or cleared by the [place] FDA  and has been authorized for detection and/or diagnosis of SARS-CoV-2  by FDA under an Emergency Use Authorization (EUA). This EUA will remain in effect (meaning this test can be used) for the duration of the COVID-19 declaration under Section 564(b)(1) of the Act, 21 U.S.C. section 360bbb-3(b)(1), unless the authorization is terminated or revoked.  Performed at [facility], [place] [address] [place], [place] [address]   Resp panel by RT-PCR (RSV, Flu A&B, Covid) Anterior Nasal Swab     Status: Abnormal   Collection Time: 07/07/22 10:54 AM   Specimen: Anterior Nasal Swab  Result Value Ref Range Status   SARS Coronavirus 2 by RT PCR POSITIVE (A) NEGATIVE Final    Comment: (NOTE) SARS-CoV-2 target nucleic acids are DETECTED.  The SARS-CoV-2 RNA is generally detectable in upper respiratory specimens during the acute phase of infection. Positive results are indicative of the presence of the identified virus, but do not rule out bacterial infection or co-infection with other pathogens not detected by the test. Clinical correlation with patient history and other diagnostic information is necessary to determine patient infection status. The expected result is Negative.  Fact Sheet for Patients: EntrepreneurPulse.com.au  Fact Sheet for Healthcare Providers: IncredibleEmployment.be  This test is not yet approved or cleared by the [place] FDA and  has been authorized for detection and/or diagnosis of SARS-CoV-2 by FDA under an Emergency Use Authorization (EUA).  This EUA will remain in effect (meaning this test can be used) for the duration of  the COVID-19 declaration under Section 564(b)(1) of the A ct, 21 U.S.C. section 360bbb-3(b)(1), unless the authorization is terminated or revoked sooner.     Influenza A by PCR NEGATIVE NEGATIVE Final   Influenza B by PCR NEGATIVE NEGATIVE Final    Comment: (NOTE) The Xpert Xpress  SARS-CoV-2/FLU/RSV plus assay is intended as an aid in the diagnosis of influenza from Nasopharyngeal swab specimens and should not be used as a sole basis for treatment. Nasal washings and aspirates are unacceptable for Xpert Xpress SARS-CoV-2/FLU/RSV testing.  Fact Sheet for Patients: EntrepreneurPulse.com.au  Fact Sheet for Healthcare Providers: IncredibleEmployment.be  This test is not yet approved or cleared by the [place] FDA and has been authorized for detection and/or diagnosis of SARS-CoV-2 by FDA under an Emergency Use Authorization (EUA). This EUA will remain in effect (meaning this test can be used) for the duration of the COVID-19 declaration under Section 564(b)(1) of the Act, 21 U.S.C. section 360bbb-3(b)(1), unless the authorization is terminated or revoked.     Resp Syncytial Virus by PCR NEGATIVE NEGATIVE Final    Comment: (NOTE) Fact Sheet for Patients: EntrepreneurPulse.com.au  Fact Sheet for Healthcare Providers: IncredibleEmployment.be  This test is not yet approved or cleared by the [place] FDA and has been authorized for detection and/or diagnosis of SARS-CoV-2 by FDA under an Emergency Use Authorization (EUA). This EUA will remain in effect (meaning this test can be used) for the duration of the COVID-19 declaration under Section 564(b)(1) of the Act, 21 U.S.C. section 360bbb-3(b)(1), unless the authorization is terminated or revoked.  Performed at [facility], [place] [address], [place], [place] [address]      Labs: BNP (last 3 results) No results for input(s): "BNP" in the last 8760 hours. Basic Metabolic Panel: Recent Labs  Lab 07/02/22 0625 07/03/22 0544 07/04/22 0529 07/05/22 0541 07/06/22 0618  NA 138 136 137 141 140  K 3.9 3.3* 3.4* 4.2 3.6  CL 101 102 102 108 107  CO2 26 22 22 23 26   GLUCOSE 90 90  102* 94 106*  BUN 12 7* 16 15 9    CREATININE 0.65 0.37* 1.21* 0.80 0.59  CALCIUM 8.5* 8.9 8.8* 9.1 8.8*  MG 2.3  --  2.2 2.1  --    Liver Function Tests: Recent Labs  Lab 07/03/22 0544  AST 41  ALT 37  ALKPHOS 62  BILITOT 0.6  PROT 7.3  ALBUMIN 3.8   No results for input(s): "LIPASE", "AMYLASE" in the last 168 hours. No results for input(s): "AMMONIA" in the last 168 hours. CBC: Recent Labs  Lab 07/01/22 0057 07/02/22 0625 07/03/22 0544 07/04/22 0529 07/05/22 0541  WBC 7.4 5.5 6.6 8.7 8.5  NEUTROABS 4.8 2.0  --   --  3.9  HGB 11.7* 11.3* 13.5 12.1 11.8*  HCT 36.1 35.2* 40.8 37.7 37.9  MCV 94.0 94.9 91.3 94.3 96.9  PLT 117* 104* 126* 151 131*   Cardiac Enzymes: No results for input(s): "CKTOTAL", "CKMB", "CKMBINDEX", "TROPONINI" in the last 168 hours. BNP: Invalid input(s): "POCBNP" CBG: No results for input(s): "GLUCAP" in the last 168 hours. D-Dimer No results for input(s): "DDIMER" in the last 72 hours. Hgb A1c No results for input(s): "HGBA1C" in the last 72 hours. Lipid Profile No results for input(s): "CHOL", "HDL", "LDLCALC", "TRIG", "CHOLHDL", "LDLDIRECT" in the last 72 hours. Thyroid function studies No results for input(s): "TSH", "T4TOTAL", "T3FREE", "THYROIDAB" in the last 72 hours.  Invalid input(s): "FREET3" Anemia work up No results for input(s): "VITAMINB12", "FOLATE", "FERRITIN", "TIBC", "IRON", "RETICCTPCT" in the last 72 hours. Urinalysis    Component Value Date/Time   COLORURINE YELLOW 07/01/2022 0158   APPEARANCEUR CLEAR 07/01/2022 0158   LABSPEC 1.019 07/01/2022 0158   PHURINE 7.0 07/01/2022 0158   GLUCOSEU NEGATIVE 07/01/2022 0158   HGBUR SMALL (A) 07/01/2022 0158   BILIRUBINUR NEGATIVE 07/01/2022 0158   KETONESUR 5 (A) 07/01/2022 0158   PROTEINUR NEGATIVE 07/01/2022 0158   NITRITE NEGATIVE 07/01/2022 0158   LEUKOCYTESUR NEGATIVE 07/01/2022 0158   Sepsis Labs Recent Labs  Lab 07/02/22 0625 07/03/22 0544 07/04/22 0529 07/05/22 0541  WBC 5.5 6.6 8.7 8.5    Microbiology Recent Results (from the past 240 hour(s))  Resp panel by RT-PCR (RSV, Flu A&B, Covid) Anterior Nasal Swab     Status: Abnormal   Collection Time: 07/01/22 12:57 AM   Specimen: Anterior Nasal Swab  Result Value Ref Range Status   SARS Coronavirus 2 by RT PCR POSITIVE (A) NEGATIVE Final    Comment: (NOTE) SARS-CoV-2 target nucleic acids are DETECTED.  The SARS-CoV-2 RNA is generally detectable in upper respiratory specimens during the acute phase of infection. Positive results are indicative of the presence of the identified virus, but do not rule out bacterial infection or co-infection with other pathogens not detected by the test. Clinical correlation with patient history and other diagnostic information is necessary to determine patient infection status. The expected result is Negative.  Fact Sheet for Patients: EntrepreneurPulse.com.au  Fact Sheet for Healthcare Providers: IncredibleEmployment.be  This test is not yet approved or cleared by the [place] FDA and  has been authorized for detection and/or diagnosis of SARS-CoV-2 by FDA under an Emergency Use Authorization (EUA).  This EUA will remain in effect (meaning this test can be used) for the duration of  the COVID-19 declaration under Section 564(b)(1) of the A ct, 21 U.S.C. section 360bbb-3(b)(1), unless the authorization is terminated or revoked sooner.     Influenza A by PCR NEGATIVE NEGATIVE Final   Influenza B by PCR NEGATIVE NEGATIVE Final  Comment: (NOTE) The Xpert Xpress SARS-CoV-2/FLU/RSV plus assay is intended as an aid in the diagnosis of influenza from Nasopharyngeal swab specimens and should not be used as a sole basis for treatment. Nasal washings and aspirates are unacceptable for Xpert Xpress SARS-CoV-2/FLU/RSV testing.  Fact Sheet for Patients: EntrepreneurPulse.com.au  Fact Sheet for Healthcare  Providers: IncredibleEmployment.be  This test is not yet approved or cleared by the [place] FDA and has been authorized for detection and/or diagnosis of SARS-CoV-2 by FDA under an Emergency Use Authorization (EUA). This EUA will remain in effect (meaning this test can be used) for the duration of the COVID-19 declaration under Section 564(b)(1) of the Act, 21 U.S.C. section 360bbb-3(b)(1), unless the authorization is terminated or revoked.     Resp Syncytial Virus by PCR NEGATIVE NEGATIVE Final    Comment: (NOTE) Fact Sheet for Patients: EntrepreneurPulse.com.au  Fact Sheet for Healthcare Providers: IncredibleEmployment.be  This test is not yet approved or cleared by the [place] FDA and has been authorized for detection and/or diagnosis of SARS-CoV-2 by FDA under an Emergency Use Authorization (EUA). This EUA will remain in effect (meaning this test can be used) for the duration of the COVID-19 declaration under Section 564(b)(1) of the Act, 21 U.S.C. section 360bbb-3(b)(1), unless the authorization is terminated or revoked.  Performed at [facility], [place] [address] [place], [place] [address]   Resp panel by RT-PCR (RSV, Flu A&B, Covid) Anterior Nasal Swab     Status: Abnormal   Collection Time: 07/07/22 10:54 AM   Specimen: Anterior Nasal Swab  Result Value Ref Range Status   SARS Coronavirus 2 by RT PCR POSITIVE (A) NEGATIVE Final    Comment: (NOTE) SARS-CoV-2 target nucleic acids are DETECTED.  The SARS-CoV-2 RNA is generally detectable in upper respiratory specimens during the acute phase of infection. Positive results are indicative of the presence of the identified virus, but do not rule out bacterial infection or co-infection with other pathogens not detected by the test. Clinical correlation with patient history and other diagnostic information is necessary to determine  patient infection status. The expected result is Negative.  Fact Sheet for Patients: EntrepreneurPulse.com.au  Fact Sheet for Healthcare Providers: IncredibleEmployment.be  This test is not yet approved or cleared by the [place] FDA and  has been authorized for detection and/or diagnosis of SARS-CoV-2 by FDA under an Emergency Use Authorization (EUA).  This EUA will remain in effect (meaning this test can be used) for the duration of  the COVID-19 declaration under Section 564(b)(1) of the A ct, 21 U.S.C. section 360bbb-3(b)(1), unless the authorization is terminated or revoked sooner.     Influenza A by PCR NEGATIVE NEGATIVE Final   Influenza B by PCR NEGATIVE NEGATIVE Final    Comment: (NOTE) The Xpert Xpress SARS-CoV-2/FLU/RSV plus assay is intended as an aid in the diagnosis of influenza from Nasopharyngeal swab specimens and should not be used as a sole basis for treatment. Nasal washings and aspirates are unacceptable for Xpert Xpress SARS-CoV-2/FLU/RSV testing.  Fact Sheet for Patients: EntrepreneurPulse.com.au  Fact Sheet for Healthcare Providers: IncredibleEmployment.be  This test is not yet approved or cleared by the [place] FDA and has been authorized for detection and/or diagnosis of SARS-CoV-2 by FDA under an Emergency Use Authorization (EUA). This EUA will remain in effect (meaning this test can be used) for the duration of the COVID-19 declaration under Section 564(b)(1) of the Act, 21 U.S.C. section 360bbb-3(b)(1), unless the authorization is terminated or revoked.  Resp Syncytial Virus by PCR NEGATIVE NEGATIVE Final    Comment: (NOTE) Fact Sheet for Patients: EntrepreneurPulse.com.au  Fact Sheet for Healthcare Providers: IncredibleEmployment.be  This test is not yet approved or cleared by the [place] FDA and has been  authorized for detection and/or diagnosis of SARS-CoV-2 by FDA under an Emergency Use Authorization (EUA). This EUA will remain in effect (meaning this test can be used) for the duration of the COVID-19 declaration under Section 564(b)(1) of the Act, 21 U.S.C. section 360bbb-3(b)(1), unless the authorization is terminated or revoked.  Performed at [facility], [place] [address] [place], [place] [address]     Please note: You were cared for by a hospitalist during your hospital stay. Once you are discharged, your primary care physician will handle any further medical issues. Please note that NO REFILLS for any discharge medications will be authorized once you are discharged, as it is imperative that you return to your primary care physician (or establish a relationship with a primary care physician if you do not have one) for your post hospital discharge needs so that they can reassess your need for medications and monitor your lab values.    Time coordinating discharge: 40 minutes  SIGNED:   Shelly Coss, MD  [facility] 07/07/2022, 2:28 PM Pager [phone]  If 7PM-7AM, please contact night-coverage [email or web address] Password [email or web address]

## 2022-09-20 ENCOUNTER — Encounter: Payer: Self-pay | Admitting: Physician Assistant

## 2022-09-20 ENCOUNTER — Ambulatory Visit (INDEPENDENT_AMBULATORY_CARE_PROVIDER_SITE_OTHER): Payer: Medicare Other | Admitting: Physician Assistant

## 2022-09-20 VITALS — BP 134/72 | HR 74 | Ht 61.0 in | Wt 110.0 lb

## 2022-09-20 DIAGNOSIS — F01518 Vascular dementia, unspecified severity, with other behavioral disturbance: Secondary | ICD-10-CM

## 2022-09-20 NOTE — Progress Notes (Signed)
Assessment/Plan:   Dementia, likely vascular, with behavioral disturbance  Sue Olsen is a very pleasant 82 y.o. RH female, with a history of hypertension, hyperlipidemia, prior history of stroke, recent COVID requiring hospitalization on 06/2022, history of dementia likely due to vascular disease, seen today in follow up for memory loss. Patient is currently on memantine 10 mg bid and donepezil 10 mg daily .  Unfortunately, progression of the disease is noted, with MMSE today of 4/30.  We had a long discussion with her daughter, given that the memory medications are not therapeutic, and daughter is interested in discontinuing them.  We discussed the importance of safety, with 24/7 monitoring, to prevent falls, as well as the importance of social interaction and cognitive stimulation at the facility.  For mood, PCP is managing and she is tolerating them well.    Follow up as needed. Her daughter reports that if there is no therapeutic benefit to the meds, follow up would not be preferable  Discontinue memantine and donepezil as they are no longer therapeutic.  Continue 24/7 monitoring for safety. Recommend good control of her cardiovascular risk factors Continue to control mood as per PCP. Patient is on Zoloft, Depakote, and quetiapine    Subjective:    This patient is accompanied in the office by her daughter who supplements the history.  Previous records as well as any outside records available were reviewed prior to todays visit. Patient was last seen on 03/01/22. Last MMSE on 05/2021 was 15/30     Any changes in memory since last visit?  Memory may be worse since her Covid.  She continues to forget family members names, especially her grandchildren.  She also forgets recent conversations.  She enjoys singing in her choir, and tries to stay active after her hospitalization for COVID which "left her very weak ". repeats oneself?  Endorsed Disoriented when walking into a room?   Sometimes she does not recognize her own place. Leaving objects in unusual places?  Endorsed.    Wandering behavior?  denies Any personality changes since last visit?  denies   Any worsening depression?:  denies   Hallucinations or paranoia?  denies   Seizures?    denies    Any sleep changes?  Sleeps well, but her last nap is at 4 PM and she goes to sleep very late.  Denies nightmares, REM behavior or sleepwalking   Sleep apnea?   denies   Any hygiene concerns?    denies   Independent of bathing and dressing?  Endorsed  Does the patient needs help with medications?  [facility]  daily from 8-8 taking care of her Who is in charge of the finances?  Daughter is in charge    Any changes in appetite?  [facility] comes to times a day to give her meals.  She may forget to eat sometimes.  She has lost a couple of pounds since her last visit Patient have trouble swallowing?  denies   Does the patient cook?  NO Any headaches?   denies   Chronic back pain  denies   Tremors? Since taking Depakote.  Ambulates with difficulty?  She uses the walker for stability.  Her steps are small. Recent falls or head injuries?  "When she had COVID in March, she was not walking, it was thought to be UTI, but she had extreme weakness.  She was about to be discharged to [facility], but she fell on the floor.  She was started PT at [facility], and within 3 days she was a different person.  She has not fallen since.  But her steps are very short and she needs a walker "-daughter says Unilateral weakness, numbness or tingling?  denies   Any incontinence of urine?  Endorsed. Uses Depends Any bowel dysfunction?   No  Patient lives at [facility] with her husband.   Does the patient drive?  She no longer drives   Initial Visit 05/26/21 The patient is seen in neurologic consultation at the request of Cleatis Polka., MD for the evaluation of short-term memory loss.  The patient is  accompanied by her daughter who provides most of the history.  This is a 82 y.o. year old RH  female resident of [facility] who has had memory issues for about 5 years.  Patient is in denial of her cognitive status. Daughter reports that the family noticed that over the years she cannot remember her family members names, especially the great grandchildren's, but able to remember the children's name.  She repeats herself over the last year, and is more disoriented when walking into her room.  She is also very anxious.  The patient is under the care of a geriatric psychiatrist, as at times she becomes combative.  She is on Zoloft 100 mg and Depakote  daily.  She needs assistance when using the phone, or doing light cooking.  She ambulates without difficulty, denies any falls or head injuries.  She has a tendency to wander off, which has happened 2 times during this year.  She no longer drives.  She lives with her husband at the facility, and most of the time she is with him.  Her mood is very anxious, no apparent depression.  It is unknown if she sleeps well, or has vivid dreams or sleepwalking.  Patient denies hallucinations or paranoia.  However, daughter reports that patient is constantly worried that her daughter is talking about her.  There are no hygiene concerns, she is independent of bathing and dressing.  Medications are administered by [facility], she was missing several doses of them.  Her daughter is in charge of the finances.  Her appetite is good, denies trouble swallowing.  She denies any headaches, double vision, dizziness, focal numbness or tingling, unilateral weakness, tremors or anosmia.  No history of seizures.  Denies urine incontinence, retention, constipation or diarrhea, OSA, alcohol or tobacco.  Family history negative for Alzheimer's disease.  She is retired from office work at age 59.   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   aspirin EC 81 MG tablet Take 1 tablet (81 mg total) by mouth daily. Swallow whole.   divalproex (DEPAKOTE) 250 MG DR tablet Take 250 mg by mouth 2 (two) times daily.   donepezil (ARICEPT) 10 MG tablet Take 1 tablet (10 mg total) by mouth daily.   memantine (NAMENDA) 10 MG tablet Take 1 tablet (10 mg total) by mouth 2 (two) times daily.   QUEtiapine (SEROQUEL) 25 MG tablet Take 0.5 tablets (12.5 mg total) by mouth  at bedtime as needed (agitation).   rosuvastatin (CRESTOR) 10 MG tablet Take 10 mg by mouth daily.   sertraline (ZOLOFT) 100 MG tablet Take 100 mg by mouth daily.   No facility-administered encounter medications on file as of 09/20/2022.       05/25/2021   10:00 AM  MMSE - Mini Mental State Exam  Orientation to time 1  Orientation to Place 2  Registration 3  Attention/ Calculation 2  Recall 0  Language- name 2 objects 2  Language- repeat 0  Language- follow 3 step command 3  Language- read & follow direction 1  Write a sentence 1  Copy design 0  Total score 15       No data to display          Objective:     PHYSICAL EXAMINATION:    VITALS:   Vitals:   09/20/22 1057  BP: 134/72  Pulse: 74  SpO2: 95%  Weight: 110 lb (49.9 kg)  Height: 5\' 1"  (1.549 m)    GEN:  The patient appears stated age and is in NAD. HEENT:  Normocephalic, atraumatic.   Neurological examination:  General: NAD, well-groomed, appears stated age. Orientation: The patient is alert. Oriented to person, not to place and date Cranial nerves: There is good facial symmetry.The speech is not fluent  but clear. No aphasia or dysarthria. Fund  of knowledge is reduced. Recent and remote memory are impaired. Attention and concentration are reduced. Unable to name objects or  repeat phrases, could not write or copy a figure.  Hearing is intact to conversational tone.   Sensation: Sensation is intact to light touch throughout Motor: Strength is at least antigravity x4. DTR's 2/4 in UE/LE     Movement examination: Tone: There is normal tone in the UE/LE Abnormal movements:  B resting and intention tremors.  No myoclonus.  No asterixis.   Coordination:  There is some decremation with RAM's L>R. Abnormal finger to nose  Gait and Station: The patient has difficulty arising out of a deep-seated chair without the use of the hands. The patient's stride length is short. Gait is cautious and narrow.    Thank you for allowing [place] the opportunity to participate in the care of this nice patient. Please do not hesitate to contact us for any questions or concerns.   Total time spent on today's visit was 36 minutes dedicated to this patient today, preparing to see patient, examining the patient, ordering tests and/or medications and counseling the patient, documenting clinical information in the EHR or other health record, independently interpreting results and communicating results to the patient/family, discussing treatment and goals, answering patient's questions and coordinating care.  Cc:  Cleatis Polka., MD  Marlowe Kays 09/20/2022 12:14 PM

## 2022-12-13 ENCOUNTER — Other Ambulatory Visit: Payer: Self-pay

## 2022-12-13 ENCOUNTER — Emergency Department (HOSPITAL_COMMUNITY): Payer: Medicare Other

## 2022-12-13 ENCOUNTER — Encounter (HOSPITAL_COMMUNITY): Payer: Self-pay | Admitting: Emergency Medicine

## 2022-12-13 ENCOUNTER — Inpatient Hospital Stay (HOSPITAL_COMMUNITY)
Admission: EM | Admit: 2022-12-13 | Discharge: 2022-12-17 | DRG: 690 | Disposition: A | Discharge status: Home Health | Payer: Medicare Other | Source: Ambulatory Visit | Attending: Family Medicine | Admitting: Family Medicine

## 2022-12-13 DIAGNOSIS — Z6823 Body mass index (BMI) 23.0-23.9, adult: Secondary | ICD-10-CM | POA: Diagnosis not present

## 2022-12-13 DIAGNOSIS — N390 Urinary tract infection, site not specified: Secondary | ICD-10-CM | POA: Diagnosis present

## 2022-12-13 DIAGNOSIS — S32591A Other specified fracture of right pubis, initial encounter for closed fracture: Secondary | ICD-10-CM | POA: Diagnosis present

## 2022-12-13 DIAGNOSIS — E785 Hyperlipidemia, unspecified: Secondary | ICD-10-CM | POA: Diagnosis present

## 2022-12-13 DIAGNOSIS — Z881 Allergy status to other antibiotic agents status: Secondary | ICD-10-CM | POA: Diagnosis not present

## 2022-12-13 DIAGNOSIS — Z79899 Other long term (current) drug therapy: Secondary | ICD-10-CM

## 2022-12-13 DIAGNOSIS — D649 Anemia, unspecified: Secondary | ICD-10-CM | POA: Diagnosis present

## 2022-12-13 DIAGNOSIS — Z66 Do not resuscitate: Secondary | ICD-10-CM | POA: Diagnosis present

## 2022-12-13 DIAGNOSIS — S32599A Other specified fracture of unspecified pubis, initial encounter for closed fracture: Secondary | ICD-10-CM | POA: Diagnosis not present

## 2022-12-13 DIAGNOSIS — F0393 Unspecified dementia, unspecified severity, with mood disturbance: Secondary | ICD-10-CM | POA: Diagnosis present

## 2022-12-13 DIAGNOSIS — N3000 Acute cystitis without hematuria: Secondary | ICD-10-CM | POA: Diagnosis not present

## 2022-12-13 DIAGNOSIS — E538 Deficiency of other specified B group vitamins: Secondary | ICD-10-CM | POA: Diagnosis present

## 2022-12-13 DIAGNOSIS — I10 Essential (primary) hypertension: Secondary | ICD-10-CM | POA: Diagnosis present

## 2022-12-13 DIAGNOSIS — W1830XA Fall on same level, unspecified, initial encounter: Secondary | ICD-10-CM | POA: Diagnosis present

## 2022-12-13 DIAGNOSIS — E440 Moderate protein-calorie malnutrition: Secondary | ICD-10-CM | POA: Diagnosis not present

## 2022-12-13 DIAGNOSIS — F32A Depression, unspecified: Secondary | ICD-10-CM | POA: Diagnosis present

## 2022-12-13 DIAGNOSIS — Z9181 History of falling: Secondary | ICD-10-CM | POA: Diagnosis not present

## 2022-12-13 DIAGNOSIS — S3210XA Unspecified fracture of sacrum, initial encounter for closed fracture: Secondary | ICD-10-CM | POA: Diagnosis present

## 2022-12-13 LAB — COMPREHENSIVE METABOLIC PANEL
ALT: 29 U/L (ref 0–44)
AST: 33 U/L (ref 15–41)
Albumin: 3.3 g/dL — ABNORMAL LOW (ref 3.5–5.0)
Alkaline Phosphatase: 83 U/L (ref 38–126)
Anion gap: 12 (ref 5–15)
BUN: 20 mg/dL (ref 8–23)
CO2: 24 mmol/L (ref 22–32)
Calcium: 9.2 mg/dL (ref 8.9–10.3)
Chloride: 100 mmol/L (ref 98–111)
Creatinine, Ser: 0.82 mg/dL (ref 0.44–1.00)
GFR, Estimated: >60 mL/min (ref >60)
Glucose, Bld: 117 mg/dL — ABNORMAL HIGH (ref 70–99)
Potassium: 4.1 mmol/L (ref 3.5–5.1)
Sodium: 136 mmol/L (ref 135–145)
Total Bilirubin: 0.7 mg/dL (ref 0.3–1.2)
Total Protein: 7 g/dL (ref 6.5–8.1)

## 2022-12-13 LAB — URINALYSIS, ROUTINE W REFLEX MICROSCOPIC
Bacteria, UA: NONE SEEN
Bilirubin Urine: NEGATIVE
Glucose, UA: NEGATIVE mg/dL
Ketones, ur: 5 mg/dL — AB
Nitrite: NEGATIVE
Protein, ur: 30 mg/dL — AB
Specific Gravity, Urine: 1.017 (ref 1.005–1.030)
WBC, UA: >50 WBC/hpf (ref 0–5)
pH: 6 (ref 5.0–8.0)

## 2022-12-13 LAB — CBC
HCT: 31.6 % — ABNORMAL LOW (ref 36.0–46.0)
Hemoglobin: 10.1 g/dL — ABNORMAL LOW (ref 12.0–15.0)
MCH: 28.1 pg (ref 26.0–34.0)
MCHC: 32 g/dL (ref 30.0–36.0)
MCV: 88 fL (ref 80.0–100.0)
Platelets: 370 10*3/uL (ref 150–400)
RBC: 3.59 MIL/uL — ABNORMAL LOW (ref 3.87–5.11)
RDW: 12.6 % (ref 11.5–15.5)
WBC: 17.5 10*3/uL — ABNORMAL HIGH (ref 4.0–10.5)
nRBC: 0 % (ref 0.0–0.2)

## 2022-12-13 MED ORDER — ACETAMINOPHEN 650 MG RE SUPP: 650.0000 mg | Freq: Four times a day (QID) | RECTAL | Status: DC | PRN

## 2022-12-13 MED ORDER — IOHEXOL 300 MG/ML  SOLN
100.0000 mL | Freq: Once | INTRAMUSCULAR | Status: AC | PRN
  Administered 2022-12-13: 100 mL via INTRAVENOUS

## 2022-12-13 MED ORDER — PIPERACILLIN-TAZOBACTAM 3.375 G IVPB
3.3750 g | Freq: Three times a day (TID) | INTRAVENOUS | Status: AC
  Administered 2022-12-14 – 2022-12-16 (×9): 3.375 g via INTRAVENOUS
  Filled 2022-12-13 (×9): qty 50

## 2022-12-13 MED ORDER — ONDANSETRON HCL 4 MG PO TABS: 4.0000 mg | ORAL_TABLET | Freq: Four times a day (QID) | ORAL | Status: DC | PRN

## 2022-12-13 MED ORDER — LACTATED RINGERS IV BOLUS
1000.0000 mL | Freq: Once | INTRAVENOUS | Status: AC
  Administered 2022-12-13: 1000 mL via INTRAVENOUS

## 2022-12-13 MED ORDER — ENOXAPARIN SODIUM 40 MG/0.4ML IJ SOSY
40.0000 mg | PREFILLED_SYRINGE | INTRAMUSCULAR | Status: DC
  Administered 2022-12-13 – 2022-12-16 (×4): 40 mg via SUBCUTANEOUS
  Filled 2022-12-13 (×4): qty 0.4

## 2022-12-13 MED ORDER — METOPROLOL TARTRATE 5 MG/5ML IV SOLN
5.0000 mg | Freq: Four times a day (QID) | INTRAVENOUS | Status: DC | PRN
  Administered 2022-12-14: 5 mg via INTRAVENOUS
  Filled 2022-12-13: qty 5

## 2022-12-13 MED ORDER — TRAZODONE HCL 50 MG PO TABS: 25.0000 mg | ORAL_TABLET | Freq: Every evening | ORAL | Status: DC | PRN

## 2022-12-13 MED ORDER — DIVALPROEX SODIUM 250 MG PO DR TAB
250.0000 mg | DELAYED_RELEASE_TABLET | Freq: Two times a day (BID) | ORAL | Status: DC
  Administered 2022-12-13 – 2022-12-17 (×8): 250 mg via ORAL
  Filled 2022-12-13 (×8): qty 1

## 2022-12-13 MED ORDER — SODIUM CHLORIDE 0.9 % IV SOLN
1.0000 g | Freq: Once | INTRAVENOUS | Status: AC
  Administered 2022-12-13: 1 g via INTRAVENOUS
  Filled 2022-12-13: qty 5

## 2022-12-13 MED ORDER — ROSUVASTATIN CALCIUM 10 MG PO TABS
10.0000 mg | ORAL_TABLET | Freq: Every day | ORAL | Status: DC
  Administered 2022-12-14 – 2022-12-17 (×4): 10 mg via ORAL
  Filled 2022-12-13 (×4): qty 1

## 2022-12-13 MED ORDER — OXYCODONE HCL 5 MG PO TABS: 5.0000 mg | ORAL_TABLET | ORAL | Status: DC | PRN

## 2022-12-13 MED ORDER — ONDANSETRON HCL 4 MG/2ML IJ SOLN: 4.0000 mg | Freq: Four times a day (QID) | INTRAMUSCULAR | Status: DC | PRN

## 2022-12-13 MED ORDER — ALBUTEROL SULFATE (2.5 MG/3ML) 0.083% IN NEBU: 2.5000 mg | INHALATION_SOLUTION | RESPIRATORY_TRACT | Status: DC | PRN

## 2022-12-13 MED ORDER — ACETAMINOPHEN 325 MG PO TABS
650.0000 mg | ORAL_TABLET | Freq: Four times a day (QID) | ORAL | Status: DC | PRN
  Administered 2022-12-13: 650 mg via ORAL
  Filled 2022-12-13: qty 2

## 2022-12-13 MED ORDER — SERTRALINE HCL 100 MG PO TABS
100.0000 mg | ORAL_TABLET | Freq: Every day | ORAL | Status: DC
  Administered 2022-12-14 – 2022-12-17 (×4): 100 mg via ORAL
  Filled 2022-12-13 (×4): qty 1

## 2022-12-13 NOTE — Progress Notes (Signed)
Pharmacy Antibiotic Note  Sue Olsen is a 82 y.o. female admitted on 12/13/2022 with UTI.  Pharmacy has been consulted for Aztreonam dosing.  Patient with UTI and pelvic fractures with worsening symptoms on oral Cipro.   She has allergy to cephalexin, however patient's daughter is unaware that she has ever received penicillin antibiotic. Discussed with admitting MD Sue Olsen) and will use Zosyn for now Renal function at patient's baseline.   Plan: Zosyn 3.375g IV q8h (4 hour infusion). Follow-up urine cx No dose adjustments anticipated.  Pharmacy will sign off and monitor peripherally via electronic surveillance software for any changes in renal function or micro data.   Height: 5\' 3"  (160 cm) Weight: 59 kg (130 lb) IBW/kg (Calculated) : 52.4  Temp (24hrs), Avg:98.8 F (37.1 C), Min:98.2 F (36.8 C), Max:99.3 F (37.4 C)  Recent Labs  Lab 12/13/22 1440  WBC 17.5*  CREATININE 0.82    Estimated Creatinine Clearance: 43 mL/min (by C-G formula based on SCr of 0.82 mg/dL).    Allergies  Allergen Reactions   Erythromycin Diarrhea and Nausea Only    Has tolerated azithromycin   Cephalexin Rash    Significant rash on body and very red rashy cheeks on face per patient's daughter     Thank you for allowing pharmacy to be a part of this patient's care.  Junita Push PharmD 12/13/2022 10:18 PM

## 2022-12-13 NOTE — H&P (Signed)
History and Physical  Sue Olsen ZOX:[id number] DOB: 1940/04/30 DOA: 12/13/2022  PCP: Cleatis Polka., MD   Chief Complaint: Weakness, confusion, pelvic pain  HPI: Sue Olsen is a 82 y.o. female with medical history significant for dementia, hyperlipidemia, hypertension being admitted to the hospital with UTI which failed outpatient antibiotics, as well as newly recognized pelvic fractures.  Patient resides in an assisted living facility with her husband who is more active, she has dementia which limits her activity and she has caregivers who are with her around-the-clock.  [name] is with her tonight providing history, she was recently started on outpatient ciprofloxacin by her PCP due to dysuria pelvic pain, confusion and more weakness than usual.  Despite being on the antibiotics she has failed to improve, particularly was brought to the ER today for evaluation due to significant pelvic pain with ambulation with trying to sit on the toilet.  Daughter states that the patient does have a history of frequent falls, though family and caregivers are not aware of any recent significant falls.  They are confident that patient must not have fallen to the ground, because she is unable to get up on her own when she falls.  Denies any fevers, chills, nausea, vomiting.  ED Course: On evaluation in the emergency department, she has been afebrile blood pressure has been a little on the high side but is actually now improved.  Lab work was done which shows leukocytosis of 17,000, otherwise CBC shows stable anemia and CMP is unremarkable.  Urinalysis was repeated which shows large leukocytes.  She was given empiric IV aztreonam due to erythromycin allergy, and hospitalist was contacted for admission.  CT scan also had incidental finding of pelvic and sacral fractures.  Review of Systems: Please see HPI for pertinent positives and negatives. A complete 10 system review of systems are  otherwise negative.  Past Medical History:  Diagnosis Date   Dementia (HCC)    Hyperlipidemia    History reviewed. No pertinent surgical history.  Social History:  reports that she has never smoked. She has never used smokeless tobacco. She reports that she does not drink alcohol and does not use drugs.   Allergies  Allergen Reactions   Erythromycin Diarrhea and Nausea Only    Has tolerated azithromycin   Cephalexin Rash    Significant rash on body and very red rashy cheeks on face per patient's daughter    History reviewed. No pertinent family history.   Prior to Admission medications   Medication Sig Start Date End Date Taking? Authorizing Provider  divalproex (DEPAKOTE) 250 MG DR tablet Take 250 mg by mouth 2 (two) times daily. 11/19/21  Yes [provider]  rosuvastatin (CRESTOR) 10 MG tablet Take 10 mg by mouth daily. 04/20/21  Yes [provider]  sertraline (ZOLOFT) 100 MG tablet Take 100 mg by mouth daily. 11/19/21  Yes [provider]  amLODipine (NORVASC) 5 MG tablet Take 1 tablet (5 mg total) by mouth daily. Patient not taking: Reported on 12/13/2022 07/08/22   Burnadette Pop, MD  aspirin EC 81 MG tablet Take 1 tablet (81 mg total) by mouth daily. Swallow whole. Patient not taking: Reported on 12/13/2022 07/07/22   Rodolph Bong, MD  ciprofloxacin (CIPRO) 500 MG tablet Take 500 mg by mouth 2 (two) times daily. Patient not taking: Reported on 12/13/2022 12/09/22   [provider]  donepezil (ARICEPT) 10 MG tablet Take 1 tablet (10 mg total) by mouth daily. Patient  not taking: Reported on 12/13/2022 08/24/21   Marcos Eke, PA-C  memantine (NAMENDA) 10 MG tablet Take 1 tablet (10 mg total) by mouth 2 (two) times daily. Patient not taking: Reported on 12/13/2022 08/09/22   Marcos Eke, PA-C  QUEtiapine (SEROQUEL) 25 MG tablet Take 0.5 tablets (12.5 mg total) by mouth at bedtime as needed (agitation). Patient not taking: Reported on  12/13/2022 07/06/22   Rodolph Bong, MD    Physical Exam: BP (!) 164/73   Pulse 81   Temp 98.5 F (36.9 C) (Oral)   Resp (!) 24   Ht 5\' 3"  (1.6 m)   Wt 59 kg   SpO2 98%   BMI 23.03 kg/m   General:  Alert, oriented to self only, calm, in no acute distress, her daughter is at the bedside Eyes: EOMI, clear conjuctivae, white sclerea Neck: supple, no masses, trachea mildline  Cardiovascular: RRR, no murmurs or rubs, no peripheral edema  Respiratory: clear to auscultation bilaterally, no wheezes, no crackles  Abdomen: soft, nontender, nondistended, normal bowel tones heard  Skin: dry, no rashes  Musculoskeletal: no joint effusions, normal range of motion  Psychiatric: appropriate affect, normal speech  Neurologic: extraocular muscles intact, clear speech, moving all extremities with intact sensorium          Labs on Admission:  Basic Metabolic Panel: Recent Labs  Lab 12/13/22 1440  NA 136  K 4.1  CL 100  CO2 24  GLUCOSE 117*  BUN 20  CREATININE 0.82  CALCIUM 9.2   Liver Function Tests: Recent Labs  Lab 12/13/22 1440  AST 33  ALT 29  ALKPHOS 83  BILITOT 0.7  PROT 7.0  ALBUMIN 3.3*   No results for input(s): "LIPASE", "AMYLASE" in the last 168 hours. No results for input(s): "AMMONIA" in the last 168 hours. CBC: Recent Labs  Lab 12/13/22 1440  WBC 17.5*  HGB 10.1*  HCT 31.6*  MCV 88.0  PLT 370   Cardiac Enzymes: No results for input(s): "CKTOTAL", "CKMB", "CKMBINDEX", "TROPONINI" in the last 168 hours.  BNP (last 3 results) No results for input(s): "BNP" in the last 8760 hours.  ProBNP (last 3 results) No results for input(s): "PROBNP" in the last 8760 hours.  CBG: No results for input(s): "GLUCAP" in the last 168 hours.  Radiological Exams on Admission: CT ABDOMEN PELVIS W CONTRAST  Result Date: 12/13/2022 CLINICAL DATA:  History of UTI with suprapubic pain, initial encounter EXAM: CT ABDOMEN AND PELVIS WITH CONTRAST TECHNIQUE: Multidetector  CT imaging of the abdomen and pelvis was performed using the standard protocol following bolus administration of intravenous contrast. RADIATION DOSE REDUCTION: This exam was performed according to the departmental dose-optimization program which includes automated exposure control, adjustment of the mA and/or kV according to patient size and/or use of iterative reconstruction technique. CONTRAST:  OMNIPAQUE IOHEXOL 300 MG/ML  SOLN COMPARISON:  05/31/2016 FINDINGS: Lower chest: No acute abnormality. Hepatobiliary: No focal liver abnormality is seen. No gallstones, gallbladder wall thickening, or biliary dilatation. Pancreas: Unremarkable. No pancreatic ductal dilatation or surrounding inflammatory changes. Spleen: Normal in size without focal abnormality. Adrenals/Urinary Tract: Adrenal glands are within normal limits. Kidneys demonstrate a normal enhancement pattern bilaterally. Normal excretion is noted bilaterally. No calculi or obstructive changes are seen. The bladder is decompressed. Small focus of air is noted within the bladder. Correlate with any recent intervention. Bladder wall thickening is seen with surrounding inflammatory changes. Stomach/Bowel: No obstructive or inflammatory changes of the colon are noted. Diverticular changes  seen without diverticulitis. The appendix is not well visualized. No inflammatory changes to suggest appendicitis are seen. The small bowel and stomach are within normal limits. Vascular/Lymphatic: Aortic atherosclerosis. No enlarged abdominal or pelvic lymph nodes. Reproductive: Status post hysterectomy. No adnexal masses. Other: No abdominal wall hernia or abnormality. No abdominopelvic ascites. Musculoskeletal: Multiple fractures are noted in the right inferior and superior pubic rami. There are edematous changes near the pubic symphysis on the right as well as the superior pubic ramus on the right likely related to focal hemorrhage. Correlate with any history of  recent fall as none is given. Minimally displaced right sacral fracture is noted adjacent to the SI joint. IMPRESSION: Bladder wall thickening suspicious for underlying UTI consistent with the patient's given clinical history. A small focus of air is noted within anteriorly which may be related to recent instrumentation. Correlate clinically. Right superior and inferior pubic rami fractures with associated localized edema suggesting an acute injury. Correlate with clinical history. Additionally a right sacral fracture is noted as well. Diverticulosis without diverticulitis. Electronically Signed   By: Alcide Clever M.D.   On: 12/13/2022 19:34   DG Chest 2 View  Result Date: 12/13/2022 CLINICAL DATA:  Elevated white blood count. EXAM: CHEST - 2 VIEW COMPARISON:  July 01, 2022.  March 17, 2016. FINDINGS: The heart size and mediastinal contours are within normal limits. Both lungs are clear. Stable old midthoracic compression fracture is noted. IMPRESSION: No active cardiopulmonary disease. Electronically Signed   By: Lupita Raider M.D.   On: 12/13/2022 15:19    Assessment/Plan This is an 82 year old female with a history of advanced dementia, hypertension, hyperlipidemia being admitted to the hospital with UTI and pelvic fractures.  UTI-previously started on p.o. ciprofloxacin by her PCP, but has had continued weakness, pelvic pain.  She has continued leukocytosis.  Suspected UTI, though much of the symptoms could also be explained by previously unrecognized pelvic fracture. -Inpatient admission -Follow urine culture -Continue empiric IV aztreonam  History of hypertension-currently not on any home medication -Continue to monitor, IV metoprolol as needed  Pelvic and sacral fractures-patient with history of prior falls with injury, but family is unaware of any recent significant falls, despite around-the-clock caregivers. -Pain control Tylenol, as needed oxycodone for severe uncontrolled  pain -Would plan orthopedic consultation in the morning, anticipate likely weightbearing as tolerated  Chronic anemia-appears to be at baseline  DVT prophylaxis: Lovenox     Code Status: DNR-confirmed with the patient's daughter at the time of admission  Consults called: None  Admission status: The appropriate patient status for this patient is INPATIENT. Inpatient status is judged to be reasonable and necessary in order to provide the required intensity of service to ensure the patient's safety. The patient's presenting symptoms, physical exam findings, and initial radiographic and laboratory data in the context of their chronic comorbidities is felt to place them at high risk for further clinical deterioration. Furthermore, it is not anticipated that the patient will be medically stable for discharge from the hospital within 2 midnights of admission.    I certify that at the point of admission it is my clinical judgment that the patient will require inpatient hospital care spanning beyond 2 midnights from the point of admission due to high intensity of service, high risk for further deterioration and high frequency of surveillance required  Time spent: 53 minutes  Prestyn Mahn Sharlette Dense MD [facility] Pager [phone]  If 7PM-7AM, please contact night-coverage [email or web address] Password [facility]  12/13/2022, 9:35  PM

## 2022-12-13 NOTE — ED Notes (Signed)
Attempted to get urine sample with no success.

## 2022-12-13 NOTE — ED Triage Notes (Signed)
Pt sent from PCP with reports of suprapubic abdominal pain. Pt taking Cipro for recent UTI. Per Drs office pt had increased WBC and was told to come to ER.

## 2022-12-13 NOTE — ED Notes (Signed)
RN aware of pt vitals

## 2022-12-13 NOTE — ED Provider Notes (Signed)
Sue Olsen EMERGENCY DEPARTMENT AT [facility] Provider Note   CSN: [id number] Arrival date & time: 12/13/22  1408     History  Chief Complaint  Patient presents with   Abdominal Pain    Sue Olsen is a 82 y.o. female.  Patient is an 82 year old female with a past medical history of dementia presenting to the emergency department with recent UTI and increasing white blood cell count.  The patient is here with her daughter and caregiver who states that last week she started to have tremors and had a decreased appetite.  They state that she was generally weak and did not want to get up and walk around.  They state that she was seen by her primary doctor and was diagnosed with a UTI and was started on Cipro.  They state that she was seen today for recheck as she did not appear to have much improvement and they noted that her white blood cell count was increasing and recommended that she come to the emergency department.  Family reports that she has been having abdominal pain on palpation but otherwise is not complaining of pain.  She has not had any fevers or vomiting.  They deny any diarrhea.  He states that she has complained of dysuria.  The history is provided by a relative and a caregiver. The history is limited by the condition of the patient (Level 5 caveat for dementia).  Abdominal Pain      Home Medications Prior to Admission medications   Medication Sig Start Date End Date Taking? Authorizing Provider  divalproex (DEPAKOTE) 250 MG DR tablet Take 250 mg by mouth 2 (two) times daily. 11/19/21  Yes [provider]  rosuvastatin (CRESTOR) 10 MG tablet Take 10 mg by mouth daily. 04/20/21  Yes [provider]  sertraline (ZOLOFT) 100 MG tablet Take 100 mg by mouth daily. 11/19/21  Yes [provider]  amLODipine (NORVASC) 5 MG tablet Take 1 tablet (5 mg total) by mouth daily. Patient not taking: Reported on 12/13/2022 07/08/22   Burnadette Pop,  MD  aspirin EC 81 MG tablet Take 1 tablet (81 mg total) by mouth daily. Swallow whole. Patient not taking: Reported on 12/13/2022 07/07/22   Rodolph Bong, MD  ciprofloxacin (CIPRO) 500 MG tablet Take 500 mg by mouth 2 (two) times daily. Patient not taking: Reported on 12/13/2022 12/09/22   [provider]  donepezil (ARICEPT) 10 MG tablet Take 1 tablet (10 mg total) by mouth daily. Patient not taking: Reported on 12/13/2022 08/24/21   Marcos Eke, PA-C  memantine (NAMENDA) 10 MG tablet Take 1 tablet (10 mg total) by mouth 2 (two) times daily. Patient not taking: Reported on 12/13/2022 08/09/22   Marcos Eke, PA-C  QUEtiapine (SEROQUEL) 25 MG tablet Take 0.5 tablets (12.5 mg total) by mouth at bedtime as needed (agitation). Patient not taking: Reported on 12/13/2022 07/06/22   Rodolph Bong, MD      Allergies    Erythromycin and Cephalexin    Review of Systems   Review of Systems  Gastrointestinal:  Positive for abdominal pain.    Physical Exam Updated Vital Signs BP (!) 163/70 (BP Location: Right Arm)   Pulse 76   Temp 98.2 F (36.8 C) (Oral)   Resp 18   Ht 5\' 3"  (1.6 m)   Wt 59 kg   SpO2 100%   BMI 23.03 kg/m  Physical Exam Vitals and nursing note reviewed.  Constitutional:  General: She is not in acute distress.    Appearance: She is well-developed.  HENT:     Head: Normocephalic and atraumatic.     Mouth/Throat:     Mouth: Mucous membranes are moist.  Eyes:     Pupils: Pupils are equal, round, and reactive to light.  Cardiovascular:     Rate and Rhythm: Normal rate and regular rhythm.     Heart sounds: Normal heart sounds.  Pulmonary:     Effort: Pulmonary effort is normal.     Breath sounds: Normal breath sounds.  Abdominal:     General: Abdomen is flat.     Palpations: Abdomen is soft.     Tenderness: There is abdominal tenderness in the suprapubic area. There is right CVA tenderness and left CVA tenderness. There is no guarding or  rebound.  Skin:    General: Skin is warm and dry.  Neurological:     General: No focal deficit present.     Mental Status: She is alert.  Psychiatric:        Mood and Affect: Mood normal.        Behavior: Behavior normal.     ED Results / Procedures / Treatments   Labs (all labs ordered are listed, but only abnormal results are displayed) Labs Reviewed  CBC - Abnormal; Notable for the following components:      Result Value   WBC 17.5 (*)    RBC 3.59 (*)    Hemoglobin 10.1 (*)    HCT 31.6 (*)    All other components within normal limits  COMPREHENSIVE METABOLIC PANEL - Abnormal; Notable for the following components:   Glucose, Bld 117 (*)    Albumin 3.3 (*)    All other components within normal limits  URINALYSIS, ROUTINE W REFLEX MICROSCOPIC - Abnormal; Notable for the following components:   APPearance HAZY (*)    Hgb urine dipstick SMALL (*)    Ketones, ur 5 (*)    Protein, ur 30 (*)    Leukocytes,Ua LARGE (*)    Non Squamous Epithelial 0-5 (*)    All other components within normal limits  URINE CULTURE  CULTURE, BLOOD (ROUTINE X 2)  CULTURE, BLOOD (ROUTINE X 2)  BASIC METABOLIC PANEL  CBC    EKG EKG Interpretation Date/Time:  Monday December 13 2022 14:15:03 EDT Ventricular Rate:  89 PR Interval:  92 QRS Duration:  94 QT Interval:  364 QTC Calculation: 443 R Axis:   17  Text Interpretation: Sinus rhythm Short PR interval Minimal ST depression, diffuse leads No significant change since last tracing Confirmed by Elayne Snare ([id number]) on 12/13/2022 4:08:11 PM  Radiology CT ABDOMEN PELVIS W CONTRAST  Result Date: 12/13/2022 CLINICAL DATA:  History of UTI with suprapubic pain, initial encounter EXAM: CT ABDOMEN AND PELVIS WITH CONTRAST TECHNIQUE: Multidetector CT imaging of the abdomen and pelvis was performed using the standard protocol following bolus administration of intravenous contrast. RADIATION DOSE REDUCTION: This exam was performed according to the  departmental dose-optimization program which includes automated exposure control, adjustment of the mA and/or kV according to patient size and/or use of iterative reconstruction technique. CONTRAST:  OMNIPAQUE IOHEXOL 300 MG/ML  SOLN COMPARISON:  05/31/2016 FINDINGS: Lower chest: No acute abnormality. Hepatobiliary: No focal liver abnormality is seen. No gallstones, gallbladder wall thickening, or biliary dilatation. Pancreas: Unremarkable. No pancreatic ductal dilatation or surrounding inflammatory changes. Spleen: Normal in size without focal abnormality. Adrenals/Urinary Tract: Adrenal glands are within normal limits. Kidneys demonstrate a  normal enhancement pattern bilaterally. Normal excretion is noted bilaterally. No calculi or obstructive changes are seen. The bladder is decompressed. Small focus of air is noted within the bladder. Correlate with any recent intervention. Bladder wall thickening is seen with surrounding inflammatory changes. Stomach/Bowel: No obstructive or inflammatory changes of the colon are noted. Diverticular changes seen without diverticulitis. The appendix is not well visualized. No inflammatory changes to suggest appendicitis are seen. The small bowel and stomach are within normal limits. Vascular/Lymphatic: Aortic atherosclerosis. No enlarged abdominal or pelvic lymph nodes. Reproductive: Status post hysterectomy. No adnexal masses. Other: No abdominal wall hernia or abnormality. No abdominopelvic ascites. Musculoskeletal: Multiple fractures are noted in the right inferior and superior pubic rami. There are edematous changes near the pubic symphysis on the right as well as the superior pubic ramus on the right likely related to focal hemorrhage. Correlate with any history of recent fall as none is given. Minimally displaced right sacral fracture is noted adjacent to the SI joint. IMPRESSION: Bladder wall thickening suspicious for underlying UTI consistent with the patient's  given clinical history. A small focus of air is noted within anteriorly which may be related to recent instrumentation. Correlate clinically. Right superior and inferior pubic rami fractures with associated localized edema suggesting an acute injury. Correlate with clinical history. Additionally a right sacral fracture is noted as well. Diverticulosis without diverticulitis. Electronically Signed   By: Alcide Clever M.D.   On: 12/13/2022 19:34   DG Chest 2 View  Result Date: 12/13/2022 CLINICAL DATA:  Elevated white blood count. EXAM: CHEST - 2 VIEW COMPARISON:  July 01, 2022.  March 17, 2016. FINDINGS: The heart size and mediastinal contours are within normal limits. Both lungs are clear. Stable old midthoracic compression fracture is noted. IMPRESSION: No active cardiopulmonary disease. Electronically Signed   By: Lupita Raider M.D.   On: 12/13/2022 15:19    Procedures Procedures    Medications Ordered in ED Medications  rosuvastatin (CRESTOR) tablet 10 mg (has no administration in time range)  sertraline (ZOLOFT) tablet 100 mg (has no administration in time range)  divalproex (DEPAKOTE) DR tablet 250 mg (250 mg Oral Given 12/13/22 2241)  enoxaparin (LOVENOX) injection 40 mg (40 mg Subcutaneous Given 12/13/22 2241)  acetaminophen (TYLENOL) tablet 650 mg (650 mg Oral Given 12/13/22 2240)    Or  acetaminophen (TYLENOL) suppository 650 mg ( Rectal See Alternative 12/13/22 2240)  traZODone (DESYREL) tablet 25 mg (has no administration in time range)  ondansetron (ZOFRAN) tablet 4 mg (has no administration in time range)    Or  ondansetron (ZOFRAN) injection 4 mg (has no administration in time range)  albuterol (PROVENTIL) (2.5 MG/3ML) 0.083% nebulizer solution 2.5 mg (has no administration in time range)  metoprolol tartrate (LOPRESSOR) injection 5 mg (has no administration in time range)  oxyCODONE (Oxy IR/ROXICODONE) immediate release tablet 5 mg (has no administration in time range)   piperacillin-tazobactam (ZOSYN) IVPB 3.375 g (has no administration in time range)  lactated ringers bolus 1,000 mL (0 mLs Intravenous Stopped 12/13/22 1759)  iohexol (OMNIPAQUE) 300 MG/ML solution 100 mL (100 mLs Intravenous Contrast Given 12/13/22 1840)  aztreonam (AZACTAM) 1 g in sodium chloride 0.9 % 100 mL IVPB (1 g Intravenous New Bag/Given 12/13/22 2034)    ED Course/ Medical Decision Making/ A&P Clinical Course as of 12/13/22 2332  Mon Dec 13, 2022  2051 CT concerning for cystitis without pyelonephritis. Also shows likely acute pubic rami fracture. Daughter reports that she does have frequent falls.  Will be admitted for IV antibiotics, PT and pain control. [VK]    Clinical Course User Index [VK] Rexford Maus, DO                                 Medical Decision Making This patient presents to the ED with chief complaint(s) of abdominal pain, increasing WBC count with pertinent past medical history of dementia, recent UTI which further complicates the presenting complaint. The complaint involves an extensive differential diagnosis and also carries with it a high risk of complications and morbidity.    The differential diagnosis includes UTI, she is hemodynamically stable making sepsis unlikely, pyelonephritis, dehydration, electrolyte abnormality, arrhythmia, anemia  Additional history obtained: Additional history obtained from family Records reviewed Primary Care Documents  ED Course and Reassessment: On patient's arrival she is hemodynamically stable in no acute distress.  She is initially evaluated by triage and had EKG, labs and chest x-ray performed.  EKG showed normal sinus rhythm without acute ischemic changes.  Chest x-ray showed no acute disease.  Labs showed a white count of 17.5 up from 16.9 and her primary care labs.  Urine is pending at this time.  She does have significant suprapubic pain and is complaining of bilateral CVA tenderness and will have a CT to  evaluate for pyelonephritis or other intra-abdominal infection.  Independent labs interpretation:  The following labs were independently interpreted: leukocytosis, pyuria  Independent visualization of imaging: - I independently visualized the following imaging with scope of interpretation limited to determining acute life threatening conditions related to emergency care: CXR, CTAP, which revealed CXR without acute disease, likely acute pubic ramus fx, cystitis   Consultation: - Consulted or discussed management/test interpretation w/ external professional: hospitalist  Consideration for admission or further workup: patient requires admission for resistent UTI and pubic ramus fx Social Determinants of health: N/A    Amount and/or Complexity of Data Reviewed Labs: ordered. Radiology: ordered.  Risk Prescription drug management. Decision regarding hospitalization.          Final Clinical Impression(s) / ED Diagnoses Final diagnoses:  Acute cystitis without hematuria  Closed fracture of pubic ramus, unspecified laterality, initial encounter [facility])    Rx / DC Orders ED Discharge Orders     None         Rexford Maus, DO 12/13/22 2332

## 2022-12-13 NOTE — ED Notes (Signed)
.ED TO INPATIENT HANDOFF REPORT  Name/Age/Gender Sue Olsen 82 y.o. female  Code Status    Code Status Orders  (From admission, onward)           Start     Ordered   12/13/22 2134  Do not attempt resuscitation (DNR)  Continuous       Question Answer Comment  If patient has no pulse and is not breathing Do Not Attempt Resuscitation   If patient has a pulse and/or is breathing: Medical Treatment Goals LIMITED ADDITIONAL INTERVENTIONS: Use medication/IV fluids and cardiac monitoring as indicated; Do not use intubation or mechanical ventilation (DNI), also provide comfort medications.  Transfer to Progressive/Stepdown as indicated, avoid Intensive Care.   Consent: Discussion documented in EHR or advanced directives reviewed      12/13/22 2134           Code Status History     Date Active Date Inactive Code Status Order ID Comments User Context   07/01/2022 0310 07/07/2022 2356 DNR [id number]  Carollee Herter, DO ED   07/01/2022 0252 07/01/2022 0310 DNR [id number]  Carollee Herter, DO ED       Home/SNF/Other Home  Chief Complaint UTI (urinary tract infection) [N39.0]  Level of Care/Admitting Diagnosis ED Disposition     ED Disposition  Admit   Condition  --   Comment  Hospital Area: [facility] [[id number]]  Level of Care: Med-Surg [16]  May admit patient to [facility] or [facility] if equivalent level of care is available:: Yes  Covid Evaluation: Asymptomatic - no recent exposure (last 10 days) testing not required  Diagnosis: UTI (urinary tract infection) [188416]  Admitting Physician: Maryln Gottron [[id number]]  Attending Physician: [facility], MIR Jaxson.Roy [[id number]]  Certification:: I certify this patient will need inpatient services for at least 2 midnights  Expected Medical Readiness: 12/16/2022          Medical History Past Medical History:  Diagnosis Date   Dementia (HCC)    Hyperlipidemia     Allergies Allergies  Allergen Reactions    Erythromycin Diarrhea and Nausea Only    Has tolerated azithromycin   Cephalexin Rash    Significant rash on body and very red rashy cheeks on face per patient's daughter    IV Location/Drains/Wounds Patient Lines/Drains/Airways Status     Active Line/Drains/Airways     Name Placement date Placement time Site Days   Peripheral IV 12/13/22 20 G 1" Left Antecubital 12/13/22  1652  Antecubital  less than 1            Labs/Imaging Results for orders placed or performed during the hospital encounter of 12/13/22 (from the past 48 hour(s))  CBC     Status: Abnormal   Collection Time: 12/13/22  2:40 PM  Result Value Ref Range   WBC 17.5 (H) 4.0 - 10.5 K/uL   RBC 3.59 (L) 3.87 - 5.11 MIL/uL   Hemoglobin 10.1 (L) 12.0 - 15.0 g/dL   HCT 55.7 (L) 32.2 - 02.5 %   MCV 88.0 80.0 - 100.0 fL   MCH 28.1 26.0 - 34.0 pg   MCHC 32.0 30.0 - 36.0 g/dL   RDW 42.7 06.2 - 37.6 %   Platelets 370 150 - 400 K/uL   nRBC 0.0 0.0 - 0.2 %    Comment: Performed at [facility], [address] [place], [place] [address]  Comprehensive metabolic panel     Status: Abnormal   Collection Time: 12/13/22  2:40 PM  Result Value Ref Range   Sodium 136 135 - 145 mmol/L   Potassium 4.1 3.5 - 5.1 mmol/L   Chloride 100 98 - 111 mmol/L   CO2 24 22 - 32 mmol/L   Glucose, Bld 117 (H) 70 - 99 mg/dL    Comment: Glucose reference range applies only to samples taken after fasting for at least 8 hours.   BUN 20 8 - 23 mg/dL   Creatinine, Ser 2.95 0.44 - 1.00 mg/dL   Calcium 9.2 8.9 - 62.1 mg/dL   Total Protein 7.0 6.5 - 8.1 g/dL   Albumin 3.3 (L) 3.5 - 5.0 g/dL   AST 33 15 - 41 U/L   ALT 29 0 - 44 U/L   Alkaline Phosphatase 83 38 - 126 U/L   Total Bilirubin 0.7 0.3 - 1.2 mg/dL   GFR, Estimated >30 >86 mL/min    Comment: (NOTE) Calculated using the CKD-EPI Creatinine Equation (2021)    Anion gap 12 5 - 15    Comment: Performed at [facility], [address],  [place], [place] [address]  Urinalysis, Routine w reflex microscopic -Urine, Clean Catch     Status: Abnormal   Collection Time: 12/13/22  5:01 PM  Result Value Ref Range   Color, Urine YELLOW YELLOW   APPearance HAZY (A) CLEAR   Specific Gravity, Urine 1.017 1.005 - 1.030   pH 6.0 5.0 - 8.0   Glucose, UA NEGATIVE NEGATIVE mg/dL   Hgb urine dipstick SMALL (A) NEGATIVE   Bilirubin Urine NEGATIVE NEGATIVE   Ketones, ur 5 (A) NEGATIVE mg/dL   Protein, ur 30 (A) NEGATIVE mg/dL   Nitrite NEGATIVE NEGATIVE   Leukocytes,Ua LARGE (A) NEGATIVE   RBC / HPF 0-5 0 - 5 RBC/hpf   WBC, UA >50 0 - 5 WBC/hpf   Bacteria, UA NONE SEEN NONE SEEN   Squamous Epithelial / HPF 0-5 0 - 5 /HPF   Mucus PRESENT    Hyaline Casts, UA PRESENT    Non Squamous Epithelial 0-5 (A) NONE SEEN    Comment: Performed at [facility], [address] [place], [place] [address]   CT ABDOMEN PELVIS W CONTRAST  Result Date: 12/13/2022 CLINICAL DATA:  History of UTI with suprapubic pain, initial encounter EXAM: CT ABDOMEN AND PELVIS WITH CONTRAST TECHNIQUE: Multidetector CT imaging of the abdomen and pelvis was performed using the standard protocol following bolus administration of intravenous contrast. RADIATION DOSE REDUCTION: This exam was performed according to the departmental dose-optimization program which includes automated exposure control, adjustment of the mA and/or kV according to patient size and/or use of iterative reconstruction technique. CONTRAST:  OMNIPAQUE IOHEXOL 300 MG/ML  SOLN COMPARISON:  05/31/2016 FINDINGS: Lower chest: No acute abnormality. Hepatobiliary: No focal liver abnormality is seen. No gallstones, gallbladder wall thickening, or biliary dilatation. Pancreas: Unremarkable. No pancreatic ductal dilatation or surrounding inflammatory changes. Spleen: Normal in size without focal abnormality. Adrenals/Urinary Tract: Adrenal glands are within normal limits. Kidneys demonstrate a normal  enhancement pattern bilaterally. Normal excretion is noted bilaterally. No calculi or obstructive changes are seen. The bladder is decompressed. Small focus of air is noted within the bladder. Correlate with any recent intervention. Bladder wall thickening is seen with surrounding inflammatory changes. Stomach/Bowel: No obstructive or inflammatory changes of the colon are noted. Diverticular changes seen without diverticulitis. The appendix is not well visualized. No inflammatory changes to suggest appendicitis are seen. The small bowel and stomach are within normal limits. Vascular/Lymphatic:  Aortic atherosclerosis. No enlarged abdominal or pelvic lymph nodes. Reproductive: Status post hysterectomy. No adnexal masses. Other: No abdominal wall hernia or abnormality. No abdominopelvic ascites. Musculoskeletal: Multiple fractures are noted in the right inferior and superior pubic rami. There are edematous changes near the pubic symphysis on the right as well as the superior pubic ramus on the right likely related to focal hemorrhage. Correlate with any history of recent fall as none is given. Minimally displaced right sacral fracture is noted adjacent to the SI joint. IMPRESSION: Bladder wall thickening suspicious for underlying UTI consistent with the patient's given clinical history. A small focus of air is noted within anteriorly which may be related to recent instrumentation. Correlate clinically. Right superior and inferior pubic rami fractures with associated localized edema suggesting an acute injury. Correlate with clinical history. Additionally a right sacral fracture is noted as well. Diverticulosis without diverticulitis. Electronically Signed   By: Alcide Clever M.D.   On: 12/13/2022 19:34   DG Chest 2 View  Result Date: 12/13/2022 CLINICAL DATA:  Elevated white blood count. EXAM: CHEST - 2 VIEW COMPARISON:  July 01, 2022.  March 17, 2016. FINDINGS: The heart size and mediastinal contours are within  normal limits. Both lungs are clear. Stable old midthoracic compression fracture is noted. IMPRESSION: No active cardiopulmonary disease. Electronically Signed   By: Lupita Raider M.D.   On: 12/13/2022 15:19    Pending Labs Unresulted Labs (From admission, onward)     Start     Ordered   12/14/22 0500  Basic metabolic panel  Tomorrow morning,   R        12/13/22 2134   12/14/22 0500  CBC  Tomorrow morning,   R        12/13/22 2134   12/13/22 2014  Culture, blood (routine x 2)  BLOOD CULTURE X 2,   R (with STAT occurrences)      12/13/22 2013   12/13/22 1928  Urine Culture (for pregnant, neutropenic or urologic patients or patients with an indwelling urinary catheter)  (Urine Labs)  Once,   URGENT       Question:  Indication  Answer:  Dysuria   12/13/22 1927            Vitals/Pain Today's Vitals   12/13/22 1413 12/13/22 1705 12/13/22 1900 12/13/22 2103  BP: (!) 183/90 138/85 (!) 164/73   Pulse: 92 74 81   Resp: 18 (!) 31 (!) 24   Temp: 99.3 F (37.4 C) 99 F (37.2 C)  98.5 F (36.9 C)  TempSrc: Oral Oral  Oral  SpO2: 95% 99% 98%   Weight: 59 kg     Height: 5\' 3"  (1.6 m)       Isolation Precautions No active isolations  Medications Medications  rosuvastatin (CRESTOR) tablet 10 mg (has no administration in time range)  sertraline (ZOLOFT) tablet 100 mg (has no administration in time range)  divalproex (DEPAKOTE) DR tablet 250 mg (has no administration in time range)  enoxaparin (LOVENOX) injection 40 mg (has no administration in time range)  acetaminophen (TYLENOL) tablet 650 mg (has no administration in time range)    Or  acetaminophen (TYLENOL) suppository 650 mg (has no administration in time range)  traZODone (DESYREL) tablet 25 mg (has no administration in time range)  ondansetron (ZOFRAN) tablet 4 mg (has no administration in time range)    Or  ondansetron (ZOFRAN) injection 4 mg (has no administration in time range)  albuterol (PROVENTIL) (2.5 MG/3ML)  0.083% nebulizer solution 2.5 mg (has no administration in time range)  metoprolol tartrate (LOPRESSOR) injection 5 mg (has no administration in time range)  oxyCODONE (Oxy IR/ROXICODONE) immediate release tablet 5 mg (has no administration in time range)  lactated ringers bolus 1,000 mL (0 mLs Intravenous Stopped 12/13/22 1759)  iohexol (OMNIPAQUE) 300 MG/ML solution 100 mL (100 mLs Intravenous Contrast Given 12/13/22 1840)  aztreonam (AZACTAM) 1 g in sodium chloride 0.9 % 100 mL IVPB (1 g Intravenous New Bag/Given 12/13/22 2034)    Mobility walks with person assist

## 2022-12-14 DIAGNOSIS — N3000 Acute cystitis without hematuria: Secondary | ICD-10-CM | POA: Diagnosis not present

## 2022-12-14 DIAGNOSIS — E440 Moderate protein-calorie malnutrition: Secondary | ICD-10-CM | POA: Insufficient documentation

## 2022-12-14 LAB — CBC
HCT: 28.8 % — ABNORMAL LOW (ref 36.0–46.0)
Hemoglobin: 8.9 g/dL — ABNORMAL LOW (ref 12.0–15.0)
MCH: 27.8 pg (ref 26.0–34.0)
MCHC: 30.9 g/dL (ref 30.0–36.0)
MCV: 90 fL (ref 80.0–100.0)
Platelets: 294 10*3/uL (ref 150–400)
RBC: 3.2 MIL/uL — ABNORMAL LOW (ref 3.87–5.11)
RDW: 12.7 % (ref 11.5–15.5)
WBC: 12.6 10*3/uL — ABNORMAL HIGH (ref 4.0–10.5)
nRBC: 0 % (ref 0.0–0.2)

## 2022-12-14 LAB — IRON AND TIBC
Iron: 28 ug/dL (ref 28–170)
Saturation Ratios: 11 % (ref 10.4–31.8)
TIBC: 245 ug/dL — ABNORMAL LOW (ref 250–450)
UIBC: 217 ug/dL

## 2022-12-14 LAB — BASIC METABOLIC PANEL
Anion gap: 7 (ref 5–15)
BUN: 18 mg/dL (ref 8–23)
CO2: 26 mmol/L (ref 22–32)
Calcium: 8.8 mg/dL — ABNORMAL LOW (ref 8.9–10.3)
Chloride: 104 mmol/L (ref 98–111)
Creatinine, Ser: 0.71 mg/dL (ref 0.44–1.00)
GFR, Estimated: >60 mL/min (ref >60)
Glucose, Bld: 100 mg/dL — ABNORMAL HIGH (ref 70–99)
Potassium: 3.8 mmol/L (ref 3.5–5.1)
Sodium: 137 mmol/L (ref 135–145)

## 2022-12-14 LAB — RETICULOCYTES
Immature Retic Fract: 32.2 % — ABNORMAL HIGH (ref 2.3–15.9)
RBC.: 3.18 MIL/uL — ABNORMAL LOW (ref 3.87–5.11)
Retic Count, Absolute: 63.9 10*3/uL (ref 19.0–186.0)
Retic Ct Pct: 2 % (ref 0.4–3.1)

## 2022-12-14 LAB — VITAMIN B12: Vitamin B-12: 175 pg/mL — ABNORMAL LOW (ref 180–914)

## 2022-12-14 LAB — URINE CULTURE: Culture: <10000 — AB

## 2022-12-14 LAB — FOLATE: Folate: 12.2 ng/mL (ref >5.9)

## 2022-12-14 LAB — FERRITIN: Ferritin: 337 ng/mL — ABNORMAL HIGH (ref 11–307)

## 2022-12-14 MED ORDER — CYANOCOBALAMIN 1000 MCG/ML IJ SOLN
1000.0000 ug | Freq: Every day | INTRAMUSCULAR | Status: DC
  Administered 2022-12-14 – 2022-12-17 (×4): 1000 ug via INTRAMUSCULAR
  Filled 2022-12-14 (×4): qty 1

## 2022-12-14 MED ORDER — VITAMIN B-12 1000 MCG PO TABS: 1000.0000 ug | ORAL_TABLET | Freq: Every day | ORAL | Status: DC

## 2022-12-14 MED ORDER — ACETAMINOPHEN 500 MG PO TABS
500.0000 mg | ORAL_TABLET | Freq: Three times a day (TID) | ORAL | Status: DC
  Administered 2022-12-14 – 2022-12-17 (×9): 500 mg via ORAL
  Filled 2022-12-14 (×9): qty 1

## 2022-12-14 MED ORDER — TRAMADOL HCL 50 MG PO TABS
25.0000 mg | ORAL_TABLET | Freq: Three times a day (TID) | ORAL | Status: DC | PRN
  Administered 2022-12-15 – 2022-12-17 (×3): 25 mg via ORAL
  Filled 2022-12-14 (×4): qty 1

## 2022-12-14 MED ORDER — ENSURE ENLIVE PO LIQD
237.0000 mL | Freq: Two times a day (BID) | ORAL | Status: DC
  Administered 2022-12-14 – 2022-12-17 (×5): 237 mL via ORAL

## 2022-12-14 NOTE — TOC Initial Note (Signed)
Transition of Care [facility]) - Initial/Assessment Note    Patient Details  Name: Sue Olsen MRN: [id number] Date of Birth: 1941-03-02  [facility] CM/SW Contact:    Amada Jupiter, LCSW Phone Number: 12/14/2022, 3:36 PM  Clinical Narrative:                  Met with pt, spouse and daughter today to review possible dc planning needs.  Pt sitting up in bed but with significant dementia - oriented to person only.  Daughter confirms that pt and spouse are in an IL apt at [facility].  They have 12 hours/ day of private duty caregivers for pt (daytime).  Family aware that, in addition to pt having UTI, imaging has noted pelvic/ sacral fxs and awaiting ortho consult.  Uncertain what dc plan will be most appropriate at this point.  Daughter is communicating with [facility] about about possible movement of patient to the Memory Care unit.  Pt has gone to SNF last year, however, daughter notes this was not a positive experience.  Plan to follow up with family tomorrow.  Expected Discharge Plan:  (TBD) Barriers to Discharge: Continued Medical Work up   Patient Goals and CMS Choice Patient states their goals for this hospitalization and ongoing recovery are:: primary goal for family is patient safety and pain control          Expected Discharge Plan and Services In-house Referral: Clinical Social Work     Living arrangements for the past 2 months: Independent Living Facility Armed forces logistics/support/administrative officer)                                      Prior Living Arrangements/Services Living arrangements for the past 2 months: Independent Living Facility Armed forces logistics/support/administrative officer) Lives with:: Spouse Patient language and need for interpreter reviewed:: Yes Do you feel safe going back to the place where you live?: Yes      Need for Family Participation in Patient Care: No (Comment) Care giver support system in place?: Yes (comment) Current home services: Homehealth aide Criminal  Activity/Legal Involvement Pertinent to Current Situation/Hospitalization: No - Comment as needed  Activities of Daily Living   ADL Screening (condition at time of admission) Patient's cognitive ability adequate to safely complete daily activities?: No Is the patient deaf or have difficulty hearing?: No Does the patient have difficulty seeing, even when wearing glasses/contacts?: No Does the patient have difficulty concentrating, remembering, or making decisions?: Yes Does the patient have difficulty dressing or bathing?: Yes Does the patient have difficulty walking or climbing stairs?: Yes  Permission Sought/Granted Permission sought to share information with : Facility Medical sales representative, Family Supports Permission granted to share information with : Yes, Verbal Permission Granted  Share Information with NAME: pt's daughter, Willeen Cass @ [phone]  Permission granted to share info w AGENCY: [facility]        Emotional Assessment Appearance:: Appears stated age Attitude/Demeanor/Rapport: Unable to Assess (pt with dementia) Affect (typically observed): Unable to Assess Orientation: : Oriented to Self Alcohol / Substance Use: Not Applicable Psych Involvement: No (comment)  Admission diagnosis:  UTI (urinary tract infection) [N39.0] Acute cystitis without hematuria [N30.00] Closed fracture of pubic ramus, unspecified laterality, initial encounter [facility]) [S32.599A] Patient Active Problem List   Diagnosis Date Noted   Malnutrition of moderate degree 12/14/2022   UTI (urinary tract infection) 12/13/2022   Generalized weakness 07/01/2022  COVID-19 virus infection 07/01/2022   DNR (do not resuscitate)/DNI(Do Not Intubate) 07/01/2022   Dehydration 07/01/2022   Dementia, vascular, mixed, with behavioral disturbance (HCC) 03/01/2022   Hyperlipidemia 12/22/2012   Vitamin D deficiency 12/22/2012   Hematuria 12/21/2012   White coat syndrome without diagnosis of  hypertension 12/19/2012   Allergic rhinitis 08/04/2011   Seborrheic dermatitis 07/14/2010   PCP:  Cleatis Polka., MD Pharmacy:   [phone] [place], [place] - [address] [facility] [facility] [address] [place] [address] Phone: [phone] Fax: [phone]  [facility] [place], [place] - [address] [place] [address] Phone: [phone] Fax: [phone]     Social Determinants of Health (SDOH) Social History: SDOH Screenings   Food Insecurity: No Food Insecurity (12/13/2022)  Housing: Low Risk  (12/13/2022)  Transportation Needs: No Transportation Needs (12/13/2022)  Utilities: Not At Risk (12/13/2022)  Depression (PHQ2-9): Low Risk  (05/15/2019)  Tobacco Use: Low Risk  (12/13/2022)   SDOH Interventions:     Readmission Risk Interventions    12/14/2022    3:25 PM  Readmission Risk Prevention Plan  Transportation Screening Complete  PCP or Specialist Appt within 5-7 Days Complete  Home Care Screening Complete  Medication Review (RN CM) Complete

## 2022-12-14 NOTE — Progress Notes (Signed)
Initial Nutrition Assessment  DOCUMENTATION CODES:   Non-severe (moderate) malnutrition in context of chronic illness  INTERVENTION:   -Ensure Plus High Protein po BID, each supplement provides 350 kcal and 20 grams of protein.   NUTRITION DIAGNOSIS:   Moderate Malnutrition related to chronic illness (dementia) as evidenced by severe fat depletion, moderate muscle depletion.  GOAL:   Patient will meet greater than or equal to 90% of their needs  MONITOR:   PO intake, Supplement acceptance, Labs, Weight trends, I & O's  REASON FOR ASSESSMENT:   Malnutrition Screening Tool    ASSESSMENT:   82 y.o. female with medical history significant for dementia, hyperlipidemia, hypertension being admitted to the hospital with UTI which failed outpatient antibiotics, as well as newly recognized pelvic fractures.  Patient in room, confused, alert/oriented x 1. Unable to provide much history. Caregiver at bedside who helped answer some questions. Reports that generally pt will consume about 1/2 of her meals. She does drink Ensure on occasion. Will order for this admission given confusion.  Per weight records, no weight loss noted. Per caregiver they have not noticed any weight changes.    Medications: Vitamin B-12  Labs reviewed.  NUTRITION - FOCUSED PHYSICAL EXAM:  Flowsheet Row Most Recent Value  Orbital Region Severe depletion  Upper Arm Region Severe depletion  Thoracic and Lumbar Region Unable to assess  Buccal Region Moderate depletion  Temple Region Moderate depletion  Clavicle Bone Region Moderate depletion  Clavicle and Acromion Bone Region Moderate depletion  Scapular Bone Region Moderate depletion  Dorsal Hand Unable to assess  [mitts]  Patellar Region Moderate depletion  Anterior Thigh Region Moderate depletion  Posterior Calf Region Moderate depletion  Edema (RD Assessment) None  Hair Reviewed  Eyes Reviewed  Mouth Reviewed  Skin Reviewed       Diet Order:    Diet Order             Diet regular Room service appropriate? Yes; Fluid consistency: Thin  Diet effective now                   EDUCATION NEEDS:   No education needs have been identified at this time  Skin:  Skin Assessment: Reviewed RN Assessment  Last BM:  8/26  Height:   Ht Readings from Last 1 Encounters:  12/13/22 5\' 3"  (1.6 m)    Weight:   Wt Readings from Last 1 Encounters:  12/13/22 59 kg    BMI:  Body mass index is 23.03 kg/m.  Estimated Nutritional Needs:   Kcal:  1450-1650  Protein:  70-85g  Fluid:  1.6L/day  Tilda Franco, MS, RD, LDN Inpatient Clinical Dietitian Contact information available via Amion

## 2022-12-14 NOTE — Progress Notes (Signed)
Radiographs reviewed Patient with pubic rami fractures on the right along with nondisplaced sacral fracture on the right Plan for progressive weightbearing as tolerated with a walker.  Patient will likely be  unable to fully weight-bear for about 2 to 3 weeks and is hard on the right side due to pain but will need  to follow-up in 2 weeks for repeat radiographs with surgical treatment in this scenario not indicated

## 2022-12-14 NOTE — Evaluation (Signed)
Physical Therapy Evaluation Patient Details Name: Sue Olsen MRN: [id number] DOB: 10/12/1940 Today's Date: 12/14/2022  History of Present Illness  82 yo female admitted with UTi, pelvic/sacral fractures. Hx of dementia, CVA, COVID, falls  Clinical Impression  Limited bed level eval-pt attempted to participate with bed mobility with cues. Limited by pain, anxiety/fear. Caregiver present to observe and provide PLOF. Will await weightbearing status orders. Will continue to follow and progress activity as pt will allow/tolerate. D/C plan will depend on family decision.  Patient will benefit from continued inpatient follow up therapy, <3 hours/day.       If plan is discharge home, recommend the following: Two people to help with walking and/or transfers;Two people to help with bathing/dressing/bathroom;Assistance with cooking/housework;Assist for transportation;Help with stairs or ramp for entrance   Can travel by private vehicle   No    Equipment Recommendations None recommended by PT  Recommendations for Other Services       Functional Status Assessment Patient has had a recent decline in their functional status and/or demonstrates limited ability to make significant improvements in function in a reasonable and predictable amount of time     Precautions / Restrictions Precautions Precautions: Fall Precaution Comments: incontinent Restrictions Weight Bearing Restrictions: No      Mobility  Bed Mobility Overal bed mobility: Needs Assistance Bed Mobility: Supine to Sit, Sit to Supine     Supine to sit: Total assist Sit to supine: Total assist   General bed mobility comments: Attempted to sit pt up at EOB-pt attempted to initiate/assist but once movement began, pt would resist, state "no"..."oh, that does hurt". Unable to get pt to EOB with +1 Assist.  Repositioned in supine.    Transfers                        Ambulation/Gait                   Stairs            Wheelchair Mobility     Tilt Bed    Modified Rankin (Stroke Patients Only)       Balance                                             Pertinent Vitals/Pain Pain Assessment Pain Assessment: Faces Faces Pain Scale: Hurts whole lot Pain Location: pt unable to state. with any movement Pain Descriptors / Indicators: Grimacing, Moaning, Guarding Pain Intervention(s): Limited activity within patient's tolerance, Repositioned    Home Living Family/patient expects to be discharged to:: Private residence Living Arrangements: Spouse/significant other Available Help at Discharge: Personal care attendant (aides: 8-3;3-8. with husband overnight) Type of Home: Independent living facility           Home Equipment: Rollator (4 wheels)      Prior Function Prior Level of Function : History of Falls (last six months);Needs assist             Mobility Comments: uses rollator with assistance-minimal ambulation. most recently requiring use of wheelchair-assist to pivot ADLs Comments: requires assistance;wears depends     Extremity/Trunk Assessment   Upper Extremity Assessment Upper Extremity Assessment: Generalized weakness    Lower Extremity Assessment Lower Extremity Assessment: Generalized weakness (unable to assess 2* pain, cognition)       Communication   Communication Communication: Difficulty following  commands/understanding;Difficulty communicating thoughts/reduced clarity of speech Following commands: Follows one step commands inconsistently Cueing Techniques: Verbal cues;Gestural cues;Tactile cues;Visual cues  Cognition Arousal: Alert Behavior During Therapy: Anxious (fearful) Overall Cognitive Status: History of cognitive impairments - at baseline                                 General Comments: anxious/fearful when moved        General Comments      Exercises     Assessment/Plan    PT  Assessment Patient needs continued PT services  PT Problem List Decreased strength;Decreased range of motion;Decreased activity tolerance;Decreased balance;Decreased mobility;Pain       PT Treatment Interventions DME instruction;Gait training;Functional mobility training;Therapeutic activities;Therapeutic exercise;Balance training;Patient/family education    PT Goals (Current goals can be found in the Care Plan section)  Acute Rehab PT Goals Patient Stated Goal: pt unable to state. hopeful to get pt to pivot PT Goal Formulation:  (with caregiver) Time For Goal Achievement: 12/28/22 Potential to Achieve Goals: Fair    Frequency Min 1X/week     Co-evaluation               AM-PAC PT "6 Clicks" Mobility  Outcome Measure Help needed turning from your back to your side while in a flat bed without using bedrails?: Total Help needed moving from lying on your back to sitting on the side of a flat bed without using bedrails?: Total Help needed moving to and from a bed to a chair (including a wheelchair)?: Total Help needed standing up from a chair using your arms (e.g., wheelchair or bedside chair)?: Total Help needed to walk in hospital room?: Total Help needed climbing 3-5 steps with a railing? : Total 6 Click Score: 6    End of Session   Activity Tolerance: Patient limited by pain Patient left: in bed;with call bell/phone within reach;with bed alarm set;with family/visitor present   PT Visit Diagnosis: Pain;History of falling (Z91.81);Difficulty in walking, not elsewhere classified (R26.2);Muscle weakness (generalized) (M62.81)    Time: 1610-9604 PT Time Calculation (min) (ACUTE ONLY): 10 min   Charges:   PT Evaluation $PT Eval Low Complexity: 1 Low   PT General Charges $$ ACUTE PT VISIT: 1 Visit            Faye Ramsay, PT Acute Rehabilitation  Office: [phone]

## 2022-12-14 NOTE — Plan of Care (Signed)
  Problem: Education: Goal: Knowledge of General Education information will improve Description Including pain rating scale, medication(s)/side effects and non-pharmacologic comfort measures Outcome: Progressing   Problem: Health Behavior/Discharge Planning: Goal: Ability to manage health-related needs will improve Outcome: Progressing   

## 2022-12-14 NOTE — Progress Notes (Addendum)
PROGRESS NOTE    CAPRICE TRUMBULL  WUJ:[id number] DOB: 05-08-1940 DOA: 12/13/2022 PCP: Cleatis Polka., MD   Brief Narrative: 82 year old with past medical history significant for dementia, hyperlipidemia, hypertension, presents  with weakness, confusion pelvic pain, recent diagnosis of urinary tract infection who  failed oral ciprofloxacin outpatient. Despite being on antibiotic patient have failed to improve, presents today with worsening pelvic pain, still having worsening confusion.  Evaluation in the ED patient was found to have white blood cells 17, UA showed large leukocytes CT pelvis showed incidental finding of pelvic and sacral fractures.  No recent falls.   Assessment & Plan:   Principal Problem:   UTI (urinary tract infection) Active Problems:   Malnutrition of moderate degree  1-Urinary tract infection: Patient was recently started on ciprofloxacin by PCP, but she continued to have leukocytosis suprapubic pain. -Continue with IV Zosyn.  -Follow white blood cell -Follow urine culture.   Hypertension: -PRN Metoprolol.   Acute Pelvic and sacral fracture: -No prior history of fall or injury, family unaware of any recent fall. -Ortho consulted, awaiting call back. [facility].  -schedule tylenol to help with pain.  Daughter report patient is very sensitive to strong opioid meds. I will order low dose Tramadol PRN. \  Chronic anemia: -Check anemia panel.  -B 12 deficiency: started B12 IM   Dementia, Depression Continue with Zoloft, trazodone, Depakote.     Moderate malnutrition:  Started on supplement.     Estimated body mass index is 23.03 kg/m as calculated from the following:   Height as of this encounter: 5\' 3"  (1.6 m).   Weight as of this encounter: 59 kg.   DVT prophylaxis: Lovenox Code Status: DNR Family Communication: Daughter updated.  Disposition Plan:  Status is: Inpatient Remains inpatient appropriate because: management of  UTI    Consultants:  ortho  Procedures:  none  Antimicrobials:    Subjective: She is alert, appears comfortable, does not appears in pain.  Caregiver at bedside.   Objective: Vitals:   12/14/22 0214 12/14/22 0300 12/14/22 0615 12/14/22 0929  BP: (!) 178/74 (!) 160/69 (!) 160/87 (!) 130/58  Pulse: 71 (!) 57 66 69  Resp: 18  18 18   Temp: 97.8 F (36.6 C)  (!) 97.5 F (36.4 C) 98.8 F (37.1 C)  TempSrc: Oral  Oral Oral  SpO2: 98%  100% 99%  Weight:      Height:        Intake/Output Summary (Last 24 hours) at 12/14/2022 1254 Last data filed at 12/14/2022 1000 Gross per 24 hour  Intake 1378.61 ml  Output 900 ml  Net 478.61 ml   Filed Weights   12/13/22 1413  Weight: 59 kg    Examination:  General exam: Appears calm and comfortable  Respiratory system: Clear to auscultation. Respiratory effort normal. Cardiovascular system: S1 & S2 heard, RRR. Gastrointestinal system: Abdomen is nondistended, soft and nontender.  Central nervous system: Alert Extremities: Symmetric 5 x 5 power.   Data Reviewed: I have personally reviewed following labs and imaging studies  CBC: Recent Labs  Lab 12/13/22 1440 12/14/22 0439  WBC 17.5* 12.6*  HGB 10.1* 8.9*  HCT 31.6* 28.8*  MCV 88.0 90.0  PLT 370 294   Basic Metabolic Panel: Recent Labs  Lab 12/13/22 1440 12/14/22 0439  NA 136 137  K 4.1 3.8  CL 100 104  CO2 24 26  GLUCOSE 117* 100*  BUN 20 18  CREATININE 0.82 0.71  CALCIUM 9.2 8.8*  GFR: Estimated Creatinine Clearance: 44.1 mL/min (by C-G formula based on SCr of 0.71 mg/dL). Liver Function Tests: Recent Labs  Lab 12/13/22 1440  AST 33  ALT 29  ALKPHOS 83  BILITOT 0.7  PROT 7.0  ALBUMIN 3.3*   No results for input(s): "LIPASE", "AMYLASE" in the last 168 hours. No results for input(s): "AMMONIA" in the last 168 hours. Coagulation Profile: No results for input(s): "INR", "PROTIME" in the last 168 hours. Cardiac Enzymes: No results for input(s):  "CKTOTAL", "CKMB", "CKMBINDEX", "TROPONINI" in the last 168 hours. BNP (last 3 results) No results for input(s): "PROBNP" in the last 8760 hours. HbA1C: No results for input(s): "HGBA1C" in the last 72 hours. CBG: No results for input(s): "GLUCAP" in the last 168 hours. Lipid Profile: No results for input(s): "CHOL", "HDL", "LDLCALC", "TRIG", "CHOLHDL", "LDLDIRECT" in the last 72 hours. Thyroid Function Tests: No results for input(s): "TSH", "T4TOTAL", "FREET4", "T3FREE", "THYROIDAB" in the last 72 hours. Anemia Panel: Recent Labs    12/14/22 0918  VITAMINB12 175*  FOLATE 12.2  FERRITIN 337*  TIBC 245*  IRON 28  RETICCTPCT 2.0   Sepsis Labs: No results for input(s): "PROCALCITON", "LATICACIDVEN" in the last 168 hours.  No results found for this or any previous visit (from the past 240 hour(s)).       Radiology Studies: CT ABDOMEN PELVIS W CONTRAST  Result Date: 12/13/2022 CLINICAL DATA:  History of UTI with suprapubic pain, initial encounter EXAM: CT ABDOMEN AND PELVIS WITH CONTRAST TECHNIQUE: Multidetector CT imaging of the abdomen and pelvis was performed using the standard protocol following bolus administration of intravenous contrast. RADIATION DOSE REDUCTION: This exam was performed according to the departmental dose-optimization program which includes automated exposure control, adjustment of the mA and/or kV according to patient size and/or use of iterative reconstruction technique. CONTRAST:  OMNIPAQUE IOHEXOL 300 MG/ML  SOLN COMPARISON:  05/31/2016 FINDINGS: Lower chest: No acute abnormality. Hepatobiliary: No focal liver abnormality is seen. No gallstones, gallbladder wall thickening, or biliary dilatation. Pancreas: Unremarkable. No pancreatic ductal dilatation or surrounding inflammatory changes. Spleen: Normal in size without focal abnormality. Adrenals/Urinary Tract: Adrenal glands are within normal limits. Kidneys demonstrate a normal enhancement pattern  bilaterally. Normal excretion is noted bilaterally. No calculi or obstructive changes are seen. The bladder is decompressed. Small focus of air is noted within the bladder. Correlate with any recent intervention. Bladder wall thickening is seen with surrounding inflammatory changes. Stomach/Bowel: No obstructive or inflammatory changes of the colon are noted. Diverticular changes seen without diverticulitis. The appendix is not well visualized. No inflammatory changes to suggest appendicitis are seen. The small bowel and stomach are within normal limits. Vascular/Lymphatic: Aortic atherosclerosis. No enlarged abdominal or pelvic lymph nodes. Reproductive: Status post hysterectomy. No adnexal masses. Other: No abdominal wall hernia or abnormality. No abdominopelvic ascites. Musculoskeletal: Multiple fractures are noted in the right inferior and superior pubic rami. There are edematous changes near the pubic symphysis on the right as well as the superior pubic ramus on the right likely related to focal hemorrhage. Correlate with any history of recent fall as none is given. Minimally displaced right sacral fracture is noted adjacent to the SI joint. IMPRESSION: Bladder wall thickening suspicious for underlying UTI consistent with the patient's given clinical history. A small focus of air is noted within anteriorly which may be related to recent instrumentation. Correlate clinically. Right superior and inferior pubic rami fractures with associated localized edema suggesting an acute injury. Correlate with clinical history. Additionally a right sacral  fracture is noted as well. Diverticulosis without diverticulitis. Electronically Signed   By: Alcide Clever M.D.   On: 12/13/2022 19:34   DG Chest 2 View  Result Date: 12/13/2022 CLINICAL DATA:  Elevated white blood count. EXAM: CHEST - 2 VIEW COMPARISON:  July 01, 2022.  March 17, 2016. FINDINGS: The heart size and mediastinal contours are within normal limits. Both  lungs are clear. Stable old midthoracic compression fracture is noted. IMPRESSION: No active cardiopulmonary disease. Electronically Signed   By: Lupita Raider M.D.   On: 12/13/2022 15:19        Scheduled Meds:  cyanocobalamin  1,000 mcg Intramuscular Daily   [START ON 12/19/2022] vitamin B-12  1,000 mcg Oral Daily   divalproex  250 mg Oral BID   enoxaparin (LOVENOX) injection  40 mg Subcutaneous Q24H   feeding supplement  237 mL Oral BID BM   rosuvastatin  10 mg Oral Daily   sertraline  100 mg Oral Daily   Continuous Infusions:  piperacillin-tazobactam (ZOSYN)  IV 3.375 g (12/14/22 1203)     LOS: 1 day    Time spent: 35 minutes.     Alba Cory, MD [facility]   If 7PM-7AM, please contact night-coverage [email or web address]  12/14/2022, 12:54 PM

## 2022-12-15 DIAGNOSIS — N3000 Acute cystitis without hematuria: Secondary | ICD-10-CM | POA: Diagnosis not present

## 2022-12-15 DIAGNOSIS — S32599A Other specified fracture of unspecified pubis, initial encounter for closed fracture: Secondary | ICD-10-CM

## 2022-12-15 DIAGNOSIS — E440 Moderate protein-calorie malnutrition: Secondary | ICD-10-CM | POA: Diagnosis not present

## 2022-12-15 NOTE — Consult Note (Signed)
Reason for Consult:right pelvic pain Referring Physician: Dr Ellery Plunk is an 82 y.o. female.  HPI: Sue Olsen is an ambulatory 82 year old patient with dementia who lives with her husband in assisted care facility.  Currently admitted for urinary tract infection.  Noted to have pelvic fractures which appear acute on the radiographic workup in the emergency department.  Denies any known history of trauma by various review of charts.  Past Medical History:  Diagnosis Date   Dementia (HCC)    Hyperlipidemia     History reviewed. No pertinent surgical history.  History reviewed. No pertinent family history.  Social History:  reports that she has never smoked. She has never used smokeless tobacco. She reports that she does not drink alcohol and does not use drugs.  Allergies:  Allergies  Allergen Reactions   Erythromycin Diarrhea and Nausea Only    Has tolerated azithromycin   Cephalexin Rash    Significant rash on body and very red rashy cheeks on face per patient's daughter    Medications: I have reviewed the patient's current medications.  Results for orders placed or performed during the hospital encounter of 12/13/22 (from the past 48 hour(s))  CBC     Status: Abnormal   Collection Time: 12/13/22  2:40 PM  Result Value Ref Range   WBC 17.5 (H) 4.0 - 10.5 K/uL   RBC 3.59 (L) 3.87 - 5.11 MIL/uL   Hemoglobin 10.1 (L) 12.0 - 15.0 g/dL   HCT 19.1 (L) 47.8 - 29.5 %   MCV 88.0 80.0 - 100.0 fL   MCH 28.1 26.0 - 34.0 pg   MCHC 32.0 30.0 - 36.0 g/dL   RDW 62.1 30.8 - 65.7 %   Platelets 370 150 - 400 K/uL   nRBC 0.0 0.0 - 0.2 %    Comment: Performed at [facility], [address] [place], [place] [address]  Comprehensive metabolic panel     Status: Abnormal   Collection Time: 12/13/22  2:40 PM  Result Value Ref Range   Sodium 136 135 - 145 mmol/L   Potassium 4.1 3.5 - 5.1 mmol/L   Chloride 100 98 - 111 mmol/L   CO2 24 22 - 32 mmol/L    Glucose, Bld 117 (H) 70 - 99 mg/dL    Comment: Glucose reference range applies only to samples taken after fasting for at least 8 hours.   BUN 20 8 - 23 mg/dL   Creatinine, Ser 2.95 0.44 - 1.00 mg/dL   Calcium 9.2 8.9 - 28.4 mg/dL   Total Protein 7.0 6.5 - 8.1 g/dL   Albumin 3.3 (L) 3.5 - 5.0 g/dL   AST 33 15 - 41 U/L   ALT 29 0 - 44 U/L   Alkaline Phosphatase 83 38 - 126 U/L   Total Bilirubin 0.7 0.3 - 1.2 mg/dL   GFR, Estimated >13 >24 mL/min    Comment: (NOTE) Calculated using the CKD-EPI Creatinine Equation (2021)    Anion gap 12 5 - 15    Comment: Performed at [facility], [address] [place], [place] [address]  Urinalysis, Routine w reflex microscopic -Urine, Clean Catch     Status: Abnormal   Collection Time: 12/13/22  5:01 PM  Result Value Ref Range   Color, Urine YELLOW YELLOW   APPearance HAZY (A) CLEAR   Specific Gravity, Urine 1.017 1.005 - 1.030   pH 6.0 5.0 - 8.0   Glucose, UA NEGATIVE NEGATIVE mg/dL   Hgb  urine dipstick SMALL (A) NEGATIVE   Bilirubin Urine NEGATIVE NEGATIVE   Ketones, ur 5 (A) NEGATIVE mg/dL   Protein, ur 30 (A) NEGATIVE mg/dL   Nitrite NEGATIVE NEGATIVE   Leukocytes,Ua LARGE (A) NEGATIVE   RBC / HPF 0-5 0 - 5 RBC/hpf   WBC, UA >50 0 - 5 WBC/hpf   Bacteria, UA NONE SEEN NONE SEEN   Squamous Epithelial / HPF 0-5 0 - 5 /HPF   Mucus PRESENT    Hyaline Casts, UA PRESENT    Non Squamous Epithelial 0-5 (A) NONE SEEN    Comment: Performed at [facility], [address], [place], [place] [address]  Urine Culture (for pregnant, neutropenic or urologic patients or patients with an indwelling urinary catheter)     Status: Abnormal   Collection Time: 12/13/22  5:01 PM   Specimen: Urine, Clean Catch  Result Value Ref Range   Specimen Description      URINE, CLEAN CATCH Performed at [facility], [address], [place], [place] [address]    Special Requests      NONE Performed at [facility], [address], [place], [place] [address]    Culture (A)     <10,000 COLONIES/mL INSIGNIFICANT GROWTH Performed at [facility], [address] [place], [place] [address]    Report Status 12/14/2022 FINAL   Culture, blood (routine x 2)     Status: None (Preliminary result)   Collection Time: 12/13/22  8:33 PM   Specimen: BLOOD  Result Value Ref Range   Specimen Description      BLOOD LEFT ANTECUBITAL Performed at [facility], [address] [place], [place] [address]    Special Requests      BOTTLES DRAWN AEROBIC AND ANAEROBIC Blood Culture results may not be optimal due to an excessive volume of blood received in culture bottles Performed at [facility], [address], [place], [place] [address]    Culture      NO GROWTH 2 DAYS Performed at [facility], [address], [place], [place] [address]    Report Status PENDING   Culture, blood (routine x 2)     Status: None (Preliminary result)   Collection Time: 12/13/22  8:34 PM   Specimen: BLOOD RIGHT HAND  Result Value Ref Range   Specimen Description      BLOOD RIGHT HAND Performed at [facility], [address] [place], [place] [address]    Special Requests      BOTTLES DRAWN AEROBIC AND ANAEROBIC Blood Culture adequate volume Performed at [facility], [address] [place], [place] [address]    Culture      NO GROWTH 2 DAYS Performed at [facility], [address], [place], [place] [address]    Report Status PENDING   Basic metabolic panel     Status: Abnormal   Collection Time: 12/14/22  4:39 AM  Result Value Ref Range   Sodium 137 135 - 145 mmol/L   Potassium 3.8 3.5 - 5.1 mmol/L   Chloride 104 98 - 111 mmol/L   CO2 26 22 - 32 mmol/L   Glucose, Bld 100 (H) 70 - 99 mg/dL    Comment: Glucose reference range applies only to samples taken after fasting for at least 8 hours.   BUN 18 8 - 23 mg/dL    Creatinine, Ser 0.27 0.44 - 1.00 mg/dL  Calcium 8.8 (L) 8.9 - 10.3 mg/dL   GFR, Estimated >11 >91 mL/min    Comment: (NOTE) Calculated using the CKD-EPI Creatinine Equation (2021)    Anion gap 7 5 - 15    Comment: Performed at [facility], [address], [place], [place] [address]  CBC     Status: Abnormal   Collection Time: 12/14/22  4:39 AM  Result Value Ref Range   WBC 12.6 (H) 4.0 - 10.5 K/uL   RBC 3.20 (L) 3.87 - 5.11 MIL/uL   Hemoglobin 8.9 (L) 12.0 - 15.0 g/dL   HCT 56.2 (L) 13.0 - 86.5 %   MCV 90.0 80.0 - 100.0 fL   MCH 27.8 26.0 - 34.0 pg   MCHC 30.9 30.0 - 36.0 g/dL   RDW 78.4 69.6 - 29.5 %   Platelets 294 150 - 400 K/uL   nRBC 0.0 0.0 - 0.2 %    Comment: Performed at [facility], [address], [place], [place] [address]  Vitamin B12     Status: Abnormal   Collection Time: 12/14/22  9:18 AM  Result Value Ref Range   Vitamin B-12 175 (L) 180 - 914 pg/mL    Comment: (NOTE) This assay is not validated for testing neonatal or myeloproliferative syndrome specimens for Vitamin B12 levels. Performed at [facility], [address], [place], [place] [address]   Folate     Status: None   Collection Time: 12/14/22  9:18 AM  Result Value Ref Range   Folate 12.2 >5.9 ng/mL    Comment: Performed at [facility], [address] [place], [place] [address]  Iron and TIBC     Status: Abnormal   Collection Time: 12/14/22  9:18 AM  Result Value Ref Range   Iron 28 28 - 170 ug/dL   TIBC 366 (L) 440 - 347 ug/dL   Saturation Ratios 11 10.4 - 31.8 %   UIBC 217 ug/dL    Comment: Performed at [facility], [address], [place], [place] [address]  Ferritin     Status: Abnormal   Collection Time: 12/14/22  9:18 AM  Result Value Ref Range   Ferritin 337 (H) 11 - 307 ng/mL    Comment: Performed at [facility], [address] [place], [place] [address]   Reticulocytes     Status: Abnormal   Collection Time: 12/14/22  9:18 AM  Result Value Ref Range   Retic Ct Pct 2.0 0.4 - 3.1 %   RBC. 3.18 (L) 3.87 - 5.11 MIL/uL   Retic Count, Absolute 63.9 19.0 - 186.0 K/uL   Immature Retic Fract 32.2 (H) 2.3 - 15.9 %    Comment: Performed at [facility], [address], [place], [place] [address]    CT ABDOMEN PELVIS W CONTRAST  Result Date: 12/13/2022 CLINICAL DATA:  History of UTI with suprapubic pain, initial encounter EXAM: CT ABDOMEN AND PELVIS WITH CONTRAST TECHNIQUE: Multidetector CT imaging of the abdomen and pelvis was performed using the standard protocol following bolus administration of intravenous contrast. RADIATION DOSE REDUCTION: This exam was performed according to the departmental dose-optimization program which includes automated exposure control, adjustment of the mA and/or kV according to patient size and/or use of iterative reconstruction technique. CONTRAST:  OMNIPAQUE IOHEXOL 300 MG/ML  SOLN COMPARISON:  05/31/2016 FINDINGS: Lower chest: No acute abnormality. Hepatobiliary: No focal liver abnormality is seen. No gallstones, gallbladder wall thickening, or biliary dilatation.  Pancreas: Unremarkable. No pancreatic ductal dilatation or surrounding inflammatory changes. Spleen: Normal in size without focal abnormality. Adrenals/Urinary Tract: Adrenal glands are within normal limits. Kidneys demonstrate a normal enhancement pattern bilaterally. Normal excretion is noted bilaterally. No calculi or obstructive changes are seen. The bladder is decompressed. Small focus of air is noted within the bladder. Correlate with any recent intervention. Bladder wall thickening is seen with surrounding inflammatory changes. Stomach/Bowel: No obstructive or inflammatory changes of the colon are noted. Diverticular changes seen without diverticulitis. The appendix is not well visualized. No inflammatory changes to suggest appendicitis  are seen. The small bowel and stomach are within normal limits. Vascular/Lymphatic: Aortic atherosclerosis. No enlarged abdominal or pelvic lymph nodes. Reproductive: Status post hysterectomy. No adnexal masses. Other: No abdominal wall hernia or abnormality. No abdominopelvic ascites. Musculoskeletal: Multiple fractures are noted in the right inferior and superior pubic rami. There are edematous changes near the pubic symphysis on the right as well as the superior pubic ramus on the right likely related to focal hemorrhage. Correlate with any history of recent fall as none is given. Minimally displaced right sacral fracture is noted adjacent to the SI joint. IMPRESSION: Bladder wall thickening suspicious for underlying UTI consistent with the patient's given clinical history. A small focus of air is noted within anteriorly which may be related to recent instrumentation. Correlate clinically. Right superior and inferior pubic rami fractures with associated localized edema suggesting an acute injury. Correlate with clinical history. Additionally a right sacral fracture is noted as well. Diverticulosis without diverticulitis. Electronically Signed   By: Alcide Clever M.D.   On: 12/13/2022 19:34   DG Chest 2 View  Result Date: 12/13/2022 CLINICAL DATA:  Elevated white blood count. EXAM: CHEST - 2 VIEW COMPARISON:  July 01, 2022.  March 17, 2016. FINDINGS: The heart size and mediastinal contours are within normal limits. Both lungs are clear. Stable old midthoracic compression fracture is noted. IMPRESSION: No active cardiopulmonary disease. Electronically Signed   By: Lupita Raider M.D.   On: 12/13/2022 15:19    Review of Systems  Unable to perform ROS: Dementia   Blood pressure (!) 146/89, pulse 70, temperature 98.5 F (36.9 C), temperature source Oral, resp. rate 18, height 5\' 3"  (1.6 m), weight 59 kg, SpO2 98%. Physical Exam Vitals reviewed.  HENT:     Head: Normocephalic.     Mouth/Throat:      Mouth: Mucous membranes are moist.  Eyes:     Extraocular Movements: Extraocular movements intact.  Cardiovascular:     Rate and Rhythm: Normal rate.  Pulmonary:     Effort: Pulmonary effort is normal.  Abdominal:     General: Abdomen is flat.     Palpations: Abdomen is soft.  Skin:    General: Skin is warm.  Neurological:     Mental Status: She is alert. She is disoriented.   Bilateral upper extremities without bruising and the patient has reasonable motion of elbow shoulders and wrist.  Left lower extremity demonstrates no knee effusion or ankle pain with range of motion.  No groin pain on the left with motion of that left leg.  Ankle dorsiflexion plantarflexion is intact.  On the right-hand side patient does have fairly significant hip and pelvic pain with motion of that right leg.  No knee effusion and ankle range of motion intact without crepitus. Assessment/Plan: Impression is nondisplaced sacral fracture on the right along with mildly displaced rami fractures on the right.  This is something that  we will take about 3 weeks to heal enough for her to mobilize without significant pain.  Currently she is having fairly significant pain with motion of that right hip.  I do think it is fine for her to mobilize touchdown weightbearing as tolerated on that right lower extremity for bed to chair transfers especially.  She will need follow-up in about 3 weeks at our orthopedic clinic for repeat radiographs and assessment.  Sue Olsen 12/15/2022, 12:33 PM

## 2022-12-15 NOTE — Progress Notes (Signed)
PROGRESS NOTE    Sue Olsen  ZOX:[id number] DOB: Feb 24, 1941 DOA: 12/13/2022 PCP: Cleatis Polka., MD   Brief Narrative: Sue Olsen is a 82 y.o. female with a history of dementia, hyperlipidemia, hypertension, depression.  Patient presented secondary to weakness, confusion, pelvic pain.  Patient with concern for possible urinary tract infection.  On initial evaluation patient was found to have evidence of pelvic and sacral fractures.  Orthopedic surgery was consulted for fractures with recommendation for nonoperative management and weightbearing as tolerated.  UTI was treated with empirically with IV antibiotics.  Urine culture with insignificant growth.   Assessment and Plan:  UTI Previously diagnosed with concern for failed treatment. Patient treated initially with aztreonam, followed by Zosyn. Urine culture with insignificant growth. -Continue Zosyn to complete a 3 day antibiotic course  Primary hypertension Patient is prescribed amlodipine but listed as not taking. Normotensive to slightly hypertensive. Will avoid aggressive blood pressure control with history of falls and age.  Leukocytosis Possibly related to UTI vs fractures. Trending down.  Acute pelvic and sacral fractures Orthopedic surgery consulted and initially recommended weightbearing as tolerated with a walker.  Orthopedic surgery amending their initial recommendation to touchdown weightbearing of right lower extremity.  Recommendation for outpatient follow-up in 3 weeks. -Continue tramadol as needed  Anemia Unclear if this is a chronic issue. Possibly some blood loss related to recent fracture. No obvious evidence of continued hemorrhage. -CBC in AM  Vitamin B12 deficiency -Continue Vitamin B12 supplementation  Dementia Noted.  Depression -Continue Zoloft, Trazodone and Depakote  Moderate malnutrition Noted.   DVT prophylaxis: Lovenox Code Status:   Code Status: DNR Family  Communication: Stepdaughter, son at bedside.  Daughter via telephone Disposition Plan: Discharge possibly back to independent living versus SNF depending on family decision in 24 hours   Consultants:  Orthopedic surgery  Procedures:  None  Antimicrobials: Aztreonam Zosyn   Subjective: Patient reports no issues this morning.  When working with physical therapy she had significant amount of pain limiting her mobility.  Objective: BP 131/71 (BP Location: Right Arm)   Pulse 72   Temp 98.6 F (37 C) (Oral)   Resp 15   Ht 5\' 3"  (1.6 m)   Wt 59 kg   SpO2 97%   BMI 23.03 kg/m   Examination:  General exam: Appears calm and comfortable Respiratory system: Clear to auscultation. Respiratory effort normal. Cardiovascular system: S1 & S2 heard, RRR. No murmurs, rubs, gallops or clicks. Gastrointestinal system: Abdomen is nondistended, soft and nontender. Normal bowel sounds heard. Central nervous system: Alert and oriented to person. Musculoskeletal: No edema. No calf tenderness   Data Reviewed: I have personally reviewed following labs and imaging studies  CBC Lab Results  Component Value Date   WBC 12.6 (H) 12/14/2022   RBC 3.18 (L) 12/14/2022   HGB 8.9 (L) 12/14/2022   HCT 28.8 (L) 12/14/2022   MCV 90.0 12/14/2022   MCH 27.8 12/14/2022   PLT 294 12/14/2022   MCHC 30.9 12/14/2022   RDW 12.7 12/14/2022   LYMPHSABS 3.7 07/05/2022   MONOABS 0.9 07/05/2022   EOSABS 0.1 07/05/2022   BASOSABS 0.0 07/05/2022     Last metabolic panel Lab Results  Component Value Date   NA 137 12/14/2022   K 3.8 12/14/2022   CL 104 12/14/2022   CO2 26 12/14/2022   BUN 18 12/14/2022   CREATININE 0.71 12/14/2022   GLUCOSE 100 (H) 12/14/2022   GFRNONAA >60 12/14/2022   CALCIUM 8.8 (L)  12/14/2022   PROT 7.0 12/13/2022   ALBUMIN 3.3 (L) 12/13/2022   BILITOT 0.7 12/13/2022   ALKPHOS 83 12/13/2022   AST 33 12/13/2022   ALT 29 12/13/2022   ANIONGAP 7 12/14/2022    GFR: Estimated  Creatinine Clearance: 44.1 mL/min (by C-G formula based on SCr of 0.71 mg/dL).  Recent Results (from the past 240 hour(s))  Urine Culture (for pregnant, neutropenic or urologic patients or patients with an indwelling urinary catheter)     Status: Abnormal   Collection Time: 12/13/22  5:01 PM   Specimen: Urine, Clean Catch  Result Value Ref Range Status   Specimen Description   Final    URINE, CLEAN CATCH Performed at [facility], [address], [place], [place] [address]    Special Requests   Final    NONE Performed at [facility], [address], [place], [place] [address]    Culture (A)  Final    <10,000 COLONIES/mL INSIGNIFICANT GROWTH Performed at [facility], [address], [place], [place] [address]    Report Status 12/14/2022 FINAL  Final  Culture, blood (routine x 2)     Status: None (Preliminary result)   Collection Time: 12/13/22  8:33 PM   Specimen: BLOOD  Result Value Ref Range Status   Specimen Description   Final    BLOOD LEFT ANTECUBITAL Performed at [facility], [address] [place], [place] [address]    Special Requests   Final    BOTTLES DRAWN AEROBIC AND ANAEROBIC Blood Culture results may not be optimal due to an excessive volume of blood received in culture bottles Performed at [facility], [address] [place], [place] [address]    Culture   Final    NO GROWTH 2 DAYS Performed at [facility], [address], [place], [place] [address]    Report Status PENDING  Incomplete  Culture, blood (routine x 2)     Status: None (Preliminary result)   Collection Time: 12/13/22  8:34 PM   Specimen: BLOOD RIGHT HAND  Result Value Ref Range Status   Specimen Description   Final    BLOOD RIGHT HAND Performed at [facility], [address], [place], [place] [address]    Special Requests   Final    BOTTLES DRAWN AEROBIC AND ANAEROBIC Blood Culture adequate  volume Performed at [facility], [address], [place], [place] [address]    Culture   Final    NO GROWTH 2 DAYS Performed at [facility], [address] [place], [place] [address]    Report Status PENDING  Incomplete      Radiology Studies: No results found.    LOS: 2 days    Jacquelin Hawking, MD [facility] 12/15/2022, 6:56 PM   If 7PM-7AM, please contact night-coverage [email or web address]

## 2022-12-15 NOTE — Progress Notes (Addendum)
Physical Therapy Treatment Patient Details Name: Sue Olsen MRN: [id number] DOB: Apr 08, 1941 Today's Date: 12/15/2022   History of Present Illness 82 yo female admitted with UTi, R pelvic/sacral fractures. Hx of dementia, CVA, COVID, falls    PT Comments  Co-tx with OT. +2 to sit pt up at EOB. Pt sat EOB for at least 8 minutes with CGA-Min A for sitting balance. Mobility is still painful however tolerance somewhat better than yesterday (pt was pre-medicated today). After sitting EOB for a bit, pt slumped head and closed eyes. BP WNL-127/66. Deferred OOB and assisted pt back into supine. Pt incontinent of stool-total assist for hygiene and changing linens. NT in at end of session to take over getting pt cleaned up. Will continue to follow and progress activity as tolerated. D/C plan will depend on family input.    If plan is discharge home, recommend the following: Two people to help with walking and/or transfers;Two people to help with bathing/dressing/bathroom;Assistance with cooking/housework;Assist for transportation;Help with stairs or ramp for entrance   Can travel by private vehicle     No  Equipment Recommendations  None recommended by PT    Recommendations for Other Services       Precautions / Restrictions Precautions Precautions: Fall Precaution Comments: incontinent Restrictions Weight Bearing Restrictions: Per Dr August Saucer progress note 8/28: "I do think it is fine for her to mobilize touchdown weightbearing as tolerated on that right lower extremity for bed to chair transfers especially."    Mobility  Bed Mobility Overal bed mobility: Needs Assistance Bed Mobility: Rolling, Supine to Sit, Sit to Supine Rolling: Max assist   Supine to sit: Total assist, +2 for physical assistance, +2 for safety/equipment Sit to supine: Total assist, +2 for physical assistance, +2 for safety/equipment   General bed mobility comments: Assist for trunk and LEs. Assist to roll. Pt  resistive intermittently. Mobility still painful and pt fearful. Tremors observed. Sat EOB for at least 8 minutes with CGA-Min A for balance. Pt then began to slump her head and close her eyes. BP 127/66. Assisted pt back to supine. Pt incontinent of stool. Assisted pt with hygiene and bedpad changing. NT in to take over.    Transfers                        Ambulation/Gait                   Stairs             Wheelchair Mobility     Tilt Bed    Modified Rankin (Stroke Patients Only)       Balance Overall balance assessment: Needs assistance Sitting-balance support: Bilateral upper extremity supported, Feet supported Sitting balance-Leahy Scale: Poor                                      Cognition Arousal: Alert Behavior During Therapy: Anxious (fearful) Overall Cognitive Status: History of cognitive impairments - at baseline                                 General Comments: anxious/fearful when moved        Exercises      General Comments        Pertinent Vitals/Pain Pain Assessment Pain Assessment: Faces Faces Pain Scale: Hurts even  more Pain Descriptors / Indicators: Grimacing, Moaning, Guarding Pain Intervention(s): Limited activity within patient's tolerance, Monitored during session, Repositioned    Home Living                          Prior Function            PT Goals (current goals can now be found in the care plan section) Progress towards PT goals: Progressing toward goals    Frequency    Min 1X/week      PT Plan      Co-evaluation              AM-PAC PT "6 Clicks" Mobility   Outcome Measure  Help needed turning from your back to your side while in a flat bed without using bedrails?: Total Help needed moving from lying on your back to sitting on the side of a flat bed without using bedrails?: Total Help needed moving to and from a bed to a chair (including a  wheelchair)?: Total Help needed standing up from a chair using your arms (e.g., wheelchair or bedside chair)?: Total Help needed to walk in hospital room?: Total Help needed climbing 3-5 steps with a railing? : Total 6 Click Score: 6    End of Session   Activity Tolerance: Patient limited by pain Patient left: in bed;with call bell/phone within reach;with nursing/sitter in room   PT Visit Diagnosis: Pain;History of falling (Z91.81);Difficulty in walking, not elsewhere classified (R26.2);Muscle weakness (generalized) (M62.81)     Time: 2130-8657 PT Time Calculation (min) (ACUTE ONLY): 27 min  Charges:    $Therapeutic Activity: 8-22 mins PT General Charges $$ ACUTE PT VISIT: 1 Visit                         Faye Ramsay, PT Acute Rehabilitation  Office: [phone]

## 2022-12-15 NOTE — Evaluation (Signed)
Occupational Therapy Evaluation Patient Details Name: Sue Olsen MRN: [id number] DOB: 1941-03-31 Today's Date: 12/15/2022   History of Present Illness Patient is a 82 year old female who was admitted with UTI and R pelvic/sacral fractures. Hx of dementia, CVA, COVID, falls   Clinical Impression   Patient is a 82 year old female who was admitted for above. Patient was living at home with 24/7 support from caregivers and family. Patient currently was very limited with pain in R pelvic area with patient needing +2 to advance to EOB. Patient having a h/o dementia making it difficult to get answers from patient. Patient was noted to have decreased functional activity tolernace, decreased ROM, decreased BUE strength, decreased endurance, decreased sitting balance, decreased standing balanced, decreased safety awareness, and decreased knowledge of AE/AD impacting participation in ADLs. Patient will benefit from continued inpatient follow up therapy, <3 hours/day. Patient would continue to benefit from skilled OT services at this time while admitted and after d/c to address noted deficits in order to improve overall safety and independence in ADLs.         If plan is discharge home, recommend the following: Two people to help with walking and/or transfers;Two people to help with bathing/dressing/bathroom;Assistance with cooking/housework;Direct supervision/assist for medications management;Assist for transportation;Help with stairs or ramp for entrance;Direct supervision/assist for financial management    Functional Status Assessment  Patient has had a recent decline in their functional status and demonstrates the ability to make significant improvements in function in a reasonable and predictable amount of time.  Equipment Recommendations  None recommended by OT       Precautions / Restrictions Precautions Precautions: Fall Precaution Comments: incontinent Restrictions Weight Bearing  Restrictions: No      Mobility Bed Mobility Overal bed mobility: Needs Assistance Bed Mobility: Rolling, Supine to Sit, Sit to Supine Rolling: Max assist   Supine to sit: Total assist, +2 for physical assistance, +2 for safety/equipment Sit to supine: Total assist, +2 for physical assistance, +2 for safety/equipment   General bed mobility comments: Assist for trunk and LEs. Assist to roll. Pt resistive intermittently. Mobility still painful and pt fearful. Tremors observed. Sat EOB for at least 8 minutes with CGA-Min A for balance. Pt then began to slump her head and close her eyes. BP 127/66. Assisted pt back to supine. Pt incontinent of stool. Assisted pt with hygiene and bedpad changing. NT in to take over.       Balance Overall balance assessment: Needs assistance Sitting-balance support: Bilateral upper extremity supported, Feet supported Sitting balance-Leahy Scale: Poor         ADL either performed or assessed with clinical judgement   ADL Overall ADL's : Needs assistance/impaired Eating/Feeding: Minimal assistance;Bed level   Grooming: Bed level;Minimal assistance   Upper Body Bathing: Bed level;Minimal assistance   Lower Body Bathing: Bed level;Total assistance   Upper Body Dressing : Bed level;Minimal assistance   Lower Body Dressing: Bed level;Total assistance     Toilet Transfer Details (indicate cue type and reason): patient was +2 to advance to EOB with increased time. patient Toileting- Architect and Hygiene: Total assistance;Bed level Toileting - Clothing Manipulation Details (indicate cue type and reason): rolling to each side to complete hygiene after having BM sitting EOB.             Vision   Additional Comments: patient preferred to keep head down with eyes closed during session. patients family/caregivers in room reporting that patient did this at home just prior  to hosptialization            Pertinent Vitals/Pain Pain  Assessment Pain Assessment: Faces Faces Pain Scale: Hurts even more Pain Location: pt unable to state. with any movement Pain Descriptors / Indicators: Grimacing, Moaning, Guarding Pain Intervention(s): Limited activity within patient's tolerance, Monitored during session, Repositioned, Premedicated before session     Extremity/Trunk Assessment Upper Extremity Assessment Upper Extremity Assessment: Difficult to assess due to impaired cognition   Lower Extremity Assessment Lower Extremity Assessment: Defer to PT evaluation   Cervical / Trunk Assessment Cervical / Trunk Assessment: Kyphotic   Communication Communication Communication: Difficulty following commands/understanding;Difficulty communicating thoughts/reduced clarity of speech Following commands: Follows one step commands consistently Cueing Techniques: Verbal cues;Gestural cues;Tactile cues   Cognition Arousal: Alert Behavior During Therapy: Anxious (fearful) Overall Cognitive Status: History of cognitive impairments - at baseline         General Comments: anxious/fearful when moved                Home Living Family/patient expects to be discharged to:: Private residence Living Arrangements: Spouse/significant other Available Help at Discharge: Personal care attendant (aids from 54-3 and 3-8 with husband present over night) Type of Home: Independent living facility         Home Equipment: Rollator (4 wheels)          Prior Functioning/Environment Prior Level of Function : History of Falls (last six months);Needs assist  Cognitive Assist : ADLs (cognitive);Mobility (cognitive) Mobility (Cognitive): Step by step cues ADLs (Cognitive): Step by step cues Physical Assist : Mobility (physical);ADLs (physical)   ADLs (physical): IADLs;Toileting;Dressing;Bathing Mobility Comments: uses rollator with assistance-minimal ambulation. most recently requiring use of wheelchair-assist to pivot ADLs Comments:  requires assistance;wears depends        OT Problem List: Decreased activity tolerance;Impaired balance (sitting and/or standing);Pain;Impaired vision/perception;Decreased knowledge of use of DME or AE;Decreased knowledge of precautions;Decreased safety awareness      OT Treatment/Interventions: Self-care/ADL training;Energy conservation;Therapeutic exercise;DME and/or AE instruction;Therapeutic activities;Patient/family education;Balance training    OT Goals(Current goals can be found in the care plan section) Acute Rehab OT Goals Patient Stated Goal: none stated OT Goal Formulation: Patient unable to participate in goal setting Time For Goal Achievement: 12/29/22 Potential to Achieve Goals: Fair  OT Frequency: Min 1X/week    Co-evaluation PT/OT/SLP Co-Evaluation/Treatment: Yes Reason for Co-Treatment: To address functional/ADL transfers PT goals addressed during session: Mobility/safety with mobility OT goals addressed during session: ADL's and self-care      AM-PAC OT "6 Clicks" Daily Activity     Outcome Measure Help from another person eating meals?: A Little Help from another person taking care of personal grooming?: A Little Help from another person toileting, which includes using toliet, bedpan, or urinal?: Total Help from another person bathing (including washing, rinsing, drying)?: Total Help from another person to put on and taking off regular upper body clothing?: A Little Help from another person to put on and taking off regular lower body clothing?: Total 6 Click Score: 12   End of Session    Activity Tolerance: Patient limited by pain Patient left: in bed;with call bell/phone within reach;with bed alarm set;with nursing/sitter in room  OT Visit Diagnosis: Unsteadiness on feet (R26.81);Other abnormalities of gait and mobility (R26.89);Muscle weakness (generalized) (M62.81);History of falling (Z91.81);Pain                Time: 3086-5784 OT Time Calculation  (min): 28 min Charges:  OT General Charges $OT Visit: 1 Visit OT Evaluation $OT  Eval Moderate Complexity: 1 Mod  Mica Releford OTR/L, MS Acute Rehabilitation Department Office# [phone]   Selinda Flavin 12/15/2022, 12:20 PM

## 2022-12-15 NOTE — Hospital Course (Addendum)
Sue Olsen is a 82 y.o. female with a history of dementia, hyperlipidemia, hypertension, depression.  Patient presented secondary to weakness, confusion, pelvic pain.  Patient with concern for possible urinary tract infection.  On initial evaluation patient was found to have evidence of pelvic and sacral fractures.  Orthopedic surgery was consulted for fractures with recommendation for nonoperative management and weightbearing as tolerated.  UTI was treated with empirically with IV antibiotics.  Urine culture with insignificant growth. Patient completed a three day course of antibiotics prior to discharge.

## 2022-12-16 DIAGNOSIS — N3000 Acute cystitis without hematuria: Secondary | ICD-10-CM | POA: Diagnosis not present

## 2022-12-16 DIAGNOSIS — E440 Moderate protein-calorie malnutrition: Secondary | ICD-10-CM | POA: Diagnosis not present

## 2022-12-16 DIAGNOSIS — S32599A Other specified fracture of unspecified pubis, initial encounter for closed fracture: Secondary | ICD-10-CM | POA: Diagnosis not present

## 2022-12-16 LAB — PROCALCITONIN: Procalcitonin: <0.1 ng/mL

## 2022-12-16 LAB — CBC
HCT: 26.9 % — ABNORMAL LOW (ref 36.0–46.0)
Hemoglobin: 8.6 g/dL — ABNORMAL LOW (ref 12.0–15.0)
MCH: 27.6 pg (ref 26.0–34.0)
MCHC: 32 g/dL (ref 30.0–36.0)
MCV: 86.2 fL (ref 80.0–100.0)
Platelets: 237 10*3/uL (ref 150–400)
RBC: 3.12 MIL/uL — ABNORMAL LOW (ref 3.87–5.11)
RDW: 12.7 % (ref 11.5–15.5)
WBC: 14.1 10*3/uL — ABNORMAL HIGH (ref 4.0–10.5)
nRBC: 0 % (ref 0.0–0.2)

## 2022-12-16 NOTE — Progress Notes (Signed)
Pts daughter at bedside - stated pt has walker and does not need additional medical equipment.  Social work notified.

## 2022-12-16 NOTE — TOC Progression Note (Signed)
Transition of Care [facility]) - Progression Note    Patient Details  Name: Sue Olsen MRN: [id number] Date of Birth: 1940-11-30  [facility] CM/SW Contact  Amada Jupiter, LCSW Phone Number: 12/16/2022, 3:49 PM  Clinical Narrative:     Have spoken with pt's daughter, Sue Olsen, today to review dc plans.  She is aware that option of SNF has been presented by therapies, however, she has spoken with [facility] and has made decision to have [place].   Daughter is putting 24/7 caregivers in place and hopes to have all set up by tomorrow.  She does request that [employer] provide therapies in the home - awaiting return call from [employer].  Appears [employer] is for pt to dc tomorrow.  Expected Discharge Plan:  (TBD) Barriers to Discharge: Continued Medical Work up  Expected Discharge Plan and Services In-house Referral: Clinical Social Work     Living arrangements for the past 2 months: Independent Living Facility ([facility])                                       Social Determinants of Health (SDOH) Interventions SDOH Screenings   Food Insecurity: No Food Insecurity (12/13/2022)  Housing: Low Risk  (12/13/2022)  Transportation Needs: No Transportation Needs (12/13/2022)  Utilities: Not At Risk (12/13/2022)  Depression (PHQ2-9): Low Risk  (05/15/2019)  Tobacco Use: Low Risk  (12/13/2022)    Readmission Risk Interventions    12/14/2022    3:25 PM  Readmission Risk Prevention Plan  Transportation Screening Complete  PCP or Specialist Appt within 5-7 Days Complete  Home Care Screening Complete  Medication Review (RN CM) Complete

## 2022-12-16 NOTE — Progress Notes (Signed)
PROGRESS NOTE    Sue Olsen  NGE:[id number] DOB: March 29, 1941 DOA: 12/13/2022 PCP: Cleatis Polka., MD   Brief Narrative: Sue Olsen is a 82 y.o. female with a history of dementia, hyperlipidemia, hypertension, depression.  Patient presented secondary to weakness, confusion, pelvic pain.  Patient with concern for possible urinary tract infection.  On initial evaluation patient was found to have evidence of pelvic and sacral fractures.  Orthopedic surgery was consulted for fractures with recommendation for nonoperative management and weightbearing as tolerated.  UTI was treated with empirically with IV antibiotics.  Urine culture with insignificant growth.   Assessment and Plan:  UTI Previously diagnosed with concern for failed treatment. Patient treated initially with aztreonam, followed by Zosyn. Urine culture with insignificant growth. -Continue Zosyn to complete a 3 day antibiotic course  Primary hypertension Patient is prescribed amlodipine but listed as not taking. Normotensive to slightly hypertensive. Will avoid aggressive blood pressure control with history of falls and age.  Leukocytosis Possibly related to UTI vs fractures. Stable. Procalcitonin is negative.  Acute pelvic and sacral fractures Orthopedic surgery consulted and initially recommended weightbearing as tolerated with a walker.  Orthopedic surgery amending their initial recommendation to touchdown weightbearing of right lower extremity.  Recommendation for outpatient follow-up in 3 weeks. -Continue tramadol as needed  Anemia Unclear if this is a chronic issue. Possibly some blood loss related to recent fracture. No obvious evidence of continued hemorrhage. -CBC in AM  Vitamin B12 deficiency -Continue Vitamin B12 supplementation  Dementia Noted.  Depression -Continue Zoloft, Trazodone and Depakote  Moderate malnutrition Noted.   DVT prophylaxis: Lovenox Code Status:   Code Status:  DNR Family Communication: Stepdaughter, son at bedside.  Daughter via telephone Disposition Plan: Medically stable for discharge.   Consultants:  Orthopedic surgery  Procedures:  None  Antimicrobials: Aztreonam Zosyn   Subjective: No concerns.  Objective: BP (!) 143/68 (BP Location: Right Arm)   Pulse 68   Temp 98.4 F (36.9 C) (Oral)   Resp 18   Ht 5\' 3"  (1.6 m)   Wt 59 kg   SpO2 99%   BMI 23.03 kg/m   Examination:  General exam: Appears calm and comfortable Respiratory system: Clear to auscultation. Respiratory effort normal. Cardiovascular system: S1 & S2 heard, RRR. Gastrointestinal system: Abdomen is nondistended, soft and nontender. Central nervous system: Alert and oriented to self. Musculoskeletal: No edema. No calf tenderness   Data Reviewed: I have personally reviewed following labs and imaging studies  CBC Lab Results  Component Value Date   WBC 14.1 (H) 12/16/2022   RBC 3.12 (L) 12/16/2022   HGB 8.6 (L) 12/16/2022   HCT 26.9 (L) 12/16/2022   MCV 86.2 12/16/2022   MCH 27.6 12/16/2022   PLT 237 12/16/2022   MCHC 32.0 12/16/2022   RDW 12.7 12/16/2022   LYMPHSABS 3.7 07/05/2022   MONOABS 0.9 07/05/2022   EOSABS 0.1 07/05/2022   BASOSABS 0.0 07/05/2022     Last metabolic panel Lab Results  Component Value Date   NA 137 12/14/2022   K 3.8 12/14/2022   CL 104 12/14/2022   CO2 26 12/14/2022   BUN 18 12/14/2022   CREATININE 0.71 12/14/2022   GLUCOSE 100 (H) 12/14/2022   GFRNONAA >60 12/14/2022   CALCIUM 8.8 (L) 12/14/2022   PROT 7.0 12/13/2022   ALBUMIN 3.3 (L) 12/13/2022   BILITOT 0.7 12/13/2022   ALKPHOS 83 12/13/2022   AST 33 12/13/2022   ALT 29 12/13/2022   ANIONGAP 7 12/14/2022  GFR: Estimated Creatinine Clearance: 44.1 mL/min (by C-G formula based on SCr of 0.71 mg/dL).  Recent Results (from the past 240 hour(s))  Urine Culture (for pregnant, neutropenic or urologic patients or patients with an indwelling urinary  catheter)     Status: Abnormal   Collection Time: 12/13/22  5:01 PM   Specimen: Urine, Clean Catch  Result Value Ref Range Status   Specimen Description   Final    URINE, CLEAN CATCH Performed at [facility], [address], [place], [place] [address]    Special Requests   Final    NONE Performed at [facility], [address] [place], [place] [address]    Culture (A)  Final    <10,000 COLONIES/mL INSIGNIFICANT GROWTH Performed at [facility], [address], [place], [place] [address]    Report Status 12/14/2022 FINAL  Final  Culture, blood (routine x 2)     Status: None (Preliminary result)   Collection Time: 12/13/22  8:33 PM   Specimen: BLOOD  Result Value Ref Range Status   Specimen Description   Final    BLOOD LEFT ANTECUBITAL Performed at [facility], [address] [place], [place] [address]    Special Requests   Final    BOTTLES DRAWN AEROBIC AND ANAEROBIC Blood Culture results may not be optimal due to an excessive volume of blood received in culture bottles Performed at [facility], [address], [place], [place] [address]    Culture   Final    NO GROWTH 3 DAYS Performed at [facility], [address], [place], [place] [address]    Report Status PENDING  Incomplete  Culture, blood (routine x 2)     Status: None (Preliminary result)   Collection Time: 12/13/22  8:34 PM   Specimen: BLOOD RIGHT HAND  Result Value Ref Range Status   Specimen Description   Final    BLOOD RIGHT HAND Performed at [facility], [address] [place], [place] [address]    Special Requests   Final    BOTTLES DRAWN AEROBIC AND ANAEROBIC Blood Culture adequate volume Performed at [facility], [address], [place], [place] [address]    Culture   Final    NO GROWTH 3 DAYS Performed at [facility], [address], [place], [place] [address]    Report  Status PENDING  Incomplete      Radiology Studies: No results found.    LOS: 3 days    Jacquelin Hawking, MD [facility] 12/16/2022, 2:48 PM   If 7PM-7AM, please contact night-coverage [email or web address]

## 2022-12-16 NOTE — Plan of Care (Signed)

## 2022-12-17 DIAGNOSIS — N3000 Acute cystitis without hematuria: Secondary | ICD-10-CM | POA: Diagnosis not present

## 2022-12-17 DIAGNOSIS — E538 Deficiency of other specified B group vitamins: Secondary | ICD-10-CM | POA: Insufficient documentation

## 2022-12-17 LAB — CBC
HCT: 29 % — ABNORMAL LOW (ref 36.0–46.0)
Hemoglobin: 9.1 g/dL — ABNORMAL LOW (ref 12.0–15.0)
MCH: 27.8 pg (ref 26.0–34.0)
MCHC: 31.4 g/dL (ref 30.0–36.0)
MCV: 88.7 fL (ref 80.0–100.0)
Platelets: 299 10*3/uL (ref 150–400)
RBC: 3.27 MIL/uL — ABNORMAL LOW (ref 3.87–5.11)
RDW: 12.4 % (ref 11.5–15.5)
WBC: 13.2 10*3/uL — ABNORMAL HIGH (ref 4.0–10.5)
nRBC: 0 % (ref 0.0–0.2)

## 2022-12-17 MED ORDER — ENSURE ENLIVE PO LIQD: 237.0000 mL | Freq: Two times a day (BID) | ORAL | 0 refills | Status: AC

## 2022-12-17 MED ORDER — TRAMADOL HCL 50 MG PO TABS: 25.0000 mg | ORAL_TABLET | Freq: Two times a day (BID) | ORAL | 0 refills | Status: DC | PRN

## 2022-12-17 MED ORDER — TRAMADOL HCL 50 MG PO TABS: 25.0000 mg | ORAL_TABLET | Freq: Two times a day (BID) | ORAL | 0 refills | Status: AC | PRN

## 2022-12-17 MED ORDER — CYANOCOBALAMIN 1000 MCG PO TABS: 1000.0000 ug | ORAL_TABLET | Freq: Every day | ORAL | 0 refills | Status: AC

## 2022-12-17 NOTE — Progress Notes (Incomplete)
PROGRESS NOTE    Sue Olsen  ZHY:[id number] DOB: [birth year] DOA: 12/13/2022 PCP: Cleatis Polka., MD   Brief Narrative: Sue Olsen is a 82 y.o. female with a history of dementia, hyperlipidemia, hypertension, depression.  Patient presented secondary to weakness, confusion, pelvic pain.  Patient with concern for possible urinary tract infection.  On initial evaluation patient was found to have evidence of pelvic and sacral fractures.  Orthopedic surgery was consulted for fractures with recommendation for nonoperative management and weightbearing as tolerated.  UTI was treated with empirically with IV antibiotics.  Urine culture with insignificant growth. Patient completed a three day course of antibiotics prior to discharge.   Assessment and Plan:  UTI Previously diagnosed with concern for failed treatment. Patient treated initially with aztreonam, followed by Zosyn IV. Urine culture with insignificant growth. Complete a three day course of antibiotics.  Primary hypertension Patient is prescribed amlodipine but listed as not taking. Normotensive to slightly hypertensive. Will avoid aggressive blood pressure control with history of falls and age.  Leukocytosis Possibly related to UTI vs fractures. Stable. Procalcitonin is negative.  Acute pelvic and sacral fractures Orthopedic surgery consulted and initially recommended weightbearing as tolerated with a walker.  Orthopedic surgery amending their initial recommendation to touchdown weightbearing of right lower extremity.  Recommendation for outpatient follow-up in 3 weeks.  Anemia Unclear if this is a chronic issue. Possibly some blood loss related to recent fracture. No obvious evidence of continued hemorrhage. -CBC in AM  Vitamin B12 deficiency -Continue Vitamin B12 supplementation  Dementia Noted.  Depression -Continue Zoloft, Trazodone and Depakote  Moderate malnutrition Noted.   DVT prophylaxis:  Lovenox Code Status:   Code Status: DNR Family Communication: Stepdaughter, son at bedside.  Daughter via telephone Disposition Plan: Medically stable for discharge.   Consultants:  Orthopedic surgery  Procedures:  None  Antimicrobials: Aztreonam Zosyn   Subjective: No concerns.  Objective: BP 135/75 (BP Location: Right Arm)   Pulse 68   Temp 98.3 F (36.8 C) (Oral)   Resp 18   Ht 5\' 3"  (1.6 m)   Wt 59 kg   SpO2 98%   BMI 23.03 kg/m   Examination:  General exam: Appears calm and comfortable Respiratory system: Clear to auscultation. Respiratory effort normal. Cardiovascular system: S1 & S2 heard, RRR. Gastrointestinal system: Abdomen is nondistended, soft and nontender. Central nervous system: Alert and oriented to self. Musculoskeletal: No edema. No calf tenderness   Data Reviewed: I have personally reviewed following labs and imaging studies  CBC Lab Results  Component Value Date   WBC 13.2 (H) 12/17/2022   RBC 3.27 (L) 12/17/2022   HGB 9.1 (L) 12/17/2022   HCT 29.0 (L) 12/17/2022   MCV 88.7 12/17/2022   MCH 27.8 12/17/2022   PLT 299 12/17/2022   MCHC 31.4 12/17/2022   RDW 12.4 12/17/2022   LYMPHSABS 3.7 07/05/2022   MONOABS 0.9 07/05/2022   EOSABS 0.1 07/05/2022   BASOSABS 0.0 07/05/2022     Last metabolic panel Lab Results  Component Value Date   NA 137 12/14/2022   K 3.8 12/14/2022   CL 104 12/14/2022   CO2 26 12/14/2022   BUN 18 12/14/2022   CREATININE 0.71 12/14/2022   GLUCOSE 100 (H) 12/14/2022   GFRNONAA >60 12/14/2022   CALCIUM 8.8 (L) 12/14/2022   PROT 7.0 12/13/2022   ALBUMIN 3.3 (L) 12/13/2022   BILITOT 0.7 12/13/2022   ALKPHOS 83 12/13/2022   AST 33 12/13/2022   ALT 29 12/13/2022  ANIONGAP 7 12/14/2022    GFR: Estimated Creatinine Clearance: 44.1 mL/min (by C-G formula based on SCr of 0.71 mg/dL).  Recent Results (from the past 240 hour(s))  Urine Culture (for pregnant, neutropenic or urologic patients or patients  with an indwelling urinary catheter)     Status: Abnormal   Collection Time: 12/13/22  5:01 PM   Specimen: Urine, Clean Catch  Result Value Ref Range Status   Specimen Description   Final    URINE, CLEAN CATCH Performed at [facility], [address] [place], [place] [address]    Special Requests   Final    NONE Performed at [facility], [address], [place], [place] [address]    Culture (A)  Final    <10,000 COLONIES/mL INSIGNIFICANT GROWTH Performed at [facility], [address] [place], [place] [address]    Report Status 12/14/2022 FINAL  Final  Culture, blood (routine x 2)     Status: None (Preliminary result)   Collection Time: 12/13/22  8:33 PM   Specimen: BLOOD  Result Value Ref Range Status   Specimen Description   Final    BLOOD LEFT ANTECUBITAL Performed at [facility], [address] [place], [place] [address]    Special Requests   Final    BOTTLES DRAWN AEROBIC AND ANAEROBIC Blood Culture results may not be optimal due to an excessive volume of blood received in culture bottles Performed at [facility], [address], [place], [place] [address]    Culture   Final    NO GROWTH 4 DAYS Performed at [facility], [address] [place], [place] [address]    Report Status PENDING  Incomplete  Culture, blood (routine x 2)     Status: None (Preliminary result)   Collection Time: 12/13/22  8:34 PM   Specimen: BLOOD RIGHT HAND  Result Value Ref Range Status   Specimen Description   Final    BLOOD RIGHT HAND Performed at [facility], [address] [place], [place] [address]    Special Requests   Final    BOTTLES DRAWN AEROBIC AND ANAEROBIC Blood Culture adequate volume Performed at [facility], [address], [place], [place] [address]    Culture   Final    NO GROWTH 4 DAYS Performed at [facility], [address]  [place], [place] [address]    Report Status PENDING  Incomplete      Radiology Studies: No results found.    LOS: 4 days    Jacquelin Hawking, MD [facility] 12/17/2022, 1:31 PM   If 7PM-7AM, please contact night-coverage [email or web address]

## 2022-12-17 NOTE — Plan of Care (Signed)
  Problem: Elimination: Goal: Will not experience complications related to urinary retention Outcome: Adequate for Discharge   

## 2022-12-17 NOTE — Progress Notes (Signed)
[facility] is here to pick up patient. They are going to remove her IV and remove the purewick.

## 2022-12-17 NOTE — Progress Notes (Signed)
Physical Therapy Treatment Patient Details Name: Sue Olsen MRN: [id number] DOB: Mar 21, 1941 Today's Date: 12/17/2022   History of Present Illness Patient is a 82 year old female who was admitted with UTI and R pelvic/sacral fractures. Hx of dementia, CVA, COVID, falls    PT Comments  General Comments: AxO x 1 intermittent requiring repeat VC's.  Personal care sitter in room.  Pt incont stool on arrival.  Assisted with peri care/removal of Pursewick.  Assisted to [facility].  Required + 2 asisst.  Then assisted to recliner and positioned to comfort.   Pt will need 24/7 care post hospital.     If plan is discharge home, recommend the following: Two people to help with walking and/or transfers;Two people to help with bathing/dressing/bathroom;Assistance with cooking/housework;Assist for transportation;Help with stairs or ramp for entrance   Can travel by private vehicle     No  Equipment Recommendations  None recommended by PT    Recommendations for Other Services       Precautions / Restrictions Precautions Precautions: Fall Precaution Comments: incontinent, dementia Restrictions Weight Bearing Restrictions: No RLE Weight Bearing: Weight bearing as tolerated     Mobility  Bed Mobility Overal bed mobility: Needs Assistance Bed Mobility: Supine to Sit     Supine to sit: Total assist, +2 for physical assistance, +2 for safety/equipment Sit to supine: Total assist, +2 for physical assistance, +2 for safety/equipment   General bed mobility comments: required Total Assist + 2 using bed pad    Transfers Overall transfer level: Needs assistance Equipment used: None Transfers: Bed to chair/wheelchair/BSC   Stand pivot transfers: Max assist, Total assist, +2 physical assistance, +2 safety/equipment         General transfer comment: assisted from bed to [facility] then again from [facility] to recliner Madx Total Asisst due to increased discomfort and confusion.    Ambulation/Gait                General Gait Details: transfers only   Stairs             Wheelchair Mobility     Tilt Bed    Modified Rankin (Stroke Patients Only)       Balance                                            Cognition Arousal: Alert Behavior During Therapy: Flat affect                                   General Comments: AxO x 1 intermittent requiring repeat VC's        Exercises      General Comments        Pertinent Vitals/Pain Pain Assessment Pain Assessment: Faces Faces Pain Scale: Hurts even more Pain Location: grimacing with activity Pain Descriptors / Indicators: Grimacing, Moaning, Guarding Pain Intervention(s): Monitored during session, Repositioned, Premedicated before session    Home Living                          Prior Function            PT Goals (current goals can now be found in the care plan section) Progress towards PT goals: Progressing toward goals    Frequency    Min 1X/week  PT Plan      Co-evaluation              AM-PAC PT "6 Clicks" Mobility   Outcome Measure  Help needed turning from your back to your side while in a flat bed without using bedrails?: Total Help needed moving from lying on your back to sitting on the side of a flat bed without using bedrails?: Total Help needed moving to and from a bed to a chair (including a wheelchair)?: Total Help needed standing up from a chair using your arms (e.g., wheelchair or bedside chair)?: Total Help needed to walk in hospital room?: Total Help needed climbing 3-5 steps with a railing? : Total 6 Click Score: 6    End of Session Equipment Utilized During Treatment: Gait belt Activity Tolerance: Patient limited by pain;Other (comment) (confusion) Patient left: in chair;with call bell/phone within reach;with chair alarm set;with nursing/sitter in room Nurse Communication: Mobility status PT Visit Diagnosis:  Pain;History of falling (Z91.81);Difficulty in walking, not elsewhere classified (R26.2);Muscle weakness (generalized) (M62.81)     Time: 6045-4098 PT Time Calculation (min) (ACUTE ONLY): 30 min  Charges:    $Therapeutic Activity: 23-37 mins                       Felecia Shelling  PTA Acute  Rehabilitation Services Office M-F          [phone]

## 2022-12-17 NOTE — Discharge Summary (Signed)
Physician Discharge Summary   Patient: Sue Olsen MRN: [id number] DOB: 01/20/1941  Admit date:     12/13/2022  Discharge date: 12/17/22  Discharge Physician: Jacquelin Hawking, MD   PCP: Cleatis Polka., MD   Recommendations at discharge:  PCP follow-up Orthopedic surgery follow-up in 3 weeks.  Discharge Diagnoses: Principal Problem:   UTI (urinary tract infection) Active Problems:   Malnutrition of moderate degree   Closed fracture of pubic ramus (HCC)   Vitamin B12 deficiency  Resolved Problems:   * No resolved hospital problems. *  Hospital Course: Sue Olsen is a 82 y.o. female with a history of dementia, hyperlipidemia, hypertension, depression.  Patient presented secondary to weakness, confusion, pelvic pain.  Patient with concern for possible urinary tract infection.  On initial evaluation patient was found to have evidence of pelvic and sacral fractures.  Orthopedic surgery was consulted for fractures with recommendation for nonoperative management and weightbearing as tolerated.  UTI was treated with empirically with IV antibiotics.  Urine culture with insignificant growth. Patient completed a three day course of antibiotics prior to discharge.  Assessment and Plan:  UTI Previously diagnosed with concern for failed treatment. Patient treated initially with aztreonam, followed by Zosyn IV. Urine culture with insignificant growth. Complete a three day course of antibiotics.   Primary hypertension Patient is prescribed amlodipine but listed as not taking. Normotensive to slightly hypertensive. Will avoid aggressive blood pressure control with history of falls and age.   Leukocytosis Possibly related to UTI vs fractures. Stable. Procalcitonin is negative.   Acute pelvic and sacral fractures Orthopedic surgery consulted and initially recommended weightbearing as tolerated with a walker.  Orthopedic surgery amending their initial recommendation to touchdown  weightbearing of right lower extremity.  Recommendation for outpatient follow-up in 3 weeks. Tramadol on discharge.   Anemia Unclear if this is a chronic issue. Possibly some blood loss related to recent fracture. No obvious evidence of continued hemorrhage. Stable.   Vitamin B12 deficiency Continue Vitamin B12 supplementation.   Dementia Noted.   Depression Continue Zoloft, Trazodone and Depakote   Moderate malnutrition Noted.   Consultants: Orthopedic surgery Procedures performed: None  Disposition:  Independent living facility Diet recommendation: Regular diet   DISCHARGE MEDICATION: Allergies as of 12/17/2022       Reactions   Erythromycin Diarrhea, Nausea Only   Has tolerated azithromycin   Cephalexin Rash   Significant rash on body and very red rashy cheeks on face per patient's daughter        Medication List     STOP taking these medications    amLODipine 5 MG tablet Commonly known as: NORVASC   aspirin EC 81 MG tablet   ciprofloxacin 500 MG tablet Commonly known as: CIPRO   donepezil 10 MG tablet Commonly known as: ARICEPT   memantine 10 MG tablet Commonly known as: NAMENDA   QUEtiapine 25 MG tablet Commonly known as: SEROQUEL       TAKE these medications    cyanocobalamin 1000 MCG tablet Take 1 tablet (1,000 mcg total) by mouth daily. Start taking on: December 19, 2022   divalproex 250 MG DR tablet Commonly known as: DEPAKOTE Take 250 mg by mouth 2 (two) times daily.   feeding supplement Liqd Take 237 mLs by mouth 2 (two) times daily between meals.   rosuvastatin 10 MG tablet Commonly known as: CRESTOR Take 10 mg by mouth daily.   sertraline 100 MG tablet Commonly known as: ZOLOFT Take 100 mg by mouth daily.  traMADol 50 MG tablet Commonly known as: ULTRAM Take 0.5 tablets (25 mg total) by mouth every 12 (twelve) hours as needed for up to 3 days for severe pain.        Follow-up Information     Cleatis Polka.,  MD. Schedule an appointment as soon as possible for a visit in 1 week(s).   Specialty: Internal Medicine Why: For hospital follow-up Contact information: [address] [place] [address] [phone]         Cammy Copa, MD. Schedule an appointment as soon as possible for a visit in 3 week(s).   Specialty: Orthopedic Surgery Why: For hospital follow-up Contact information: [address] [place] [address] [phone]                Discharge Exam: BP 135/75 (BP Location: Right Arm)   Pulse 68   Temp 98.3 F (36.8 C) (Oral)   Resp 18   Ht 5\' 3"  (1.6 m)   Wt 59 kg   SpO2 98%   BMI 23.03 kg/m   General exam: Appears calm and comfortable Respiratory system: Clear to auscultation. Respiratory effort normal. Cardiovascular system: S1 & S2 heard, RRR. Gastrointestinal system: Abdomen is nondistended, soft and nontender. Normal bowel sounds heard. Central nervous system: Alert and oriented to self. Skin: No cyanosis.   Condition at discharge: stable  The results of significant diagnostics from this hospitalization (including imaging, microbiology, ancillary and laboratory) are listed below for reference.   Imaging Studies: CT ABDOMEN PELVIS W CONTRAST  Result Date: 12/13/2022 CLINICAL DATA:  History of UTI with suprapubic pain, initial encounter EXAM: CT ABDOMEN AND PELVIS WITH CONTRAST TECHNIQUE: Multidetector CT imaging of the abdomen and pelvis was performed using the standard protocol following bolus administration of intravenous contrast. RADIATION DOSE REDUCTION: This exam was performed according to the departmental dose-optimization program which includes automated exposure control, adjustment of the mA and/or kV according to patient size and/or use of iterative reconstruction technique. CONTRAST:  OMNIPAQUE IOHEXOL 300 MG/ML  SOLN COMPARISON:  05/31/2016 FINDINGS: Lower chest: No acute abnormality. Hepatobiliary: No focal liver  abnormality is seen. No gallstones, gallbladder wall thickening, or biliary dilatation. Pancreas: Unremarkable. No pancreatic ductal dilatation or surrounding inflammatory changes. Spleen: Normal in size without focal abnormality. Adrenals/Urinary Tract: Adrenal glands are within normal limits. Kidneys demonstrate a normal enhancement pattern bilaterally. Normal excretion is noted bilaterally. No calculi or obstructive changes are seen. The bladder is decompressed. Small focus of air is noted within the bladder. Correlate with any recent intervention. Bladder wall thickening is seen with surrounding inflammatory changes. Stomach/Bowel: No obstructive or inflammatory changes of the colon are noted. Diverticular changes seen without diverticulitis. The appendix is not well visualized. No inflammatory changes to suggest appendicitis are seen. The small bowel and stomach are within normal limits. Vascular/Lymphatic: Aortic atherosclerosis. No enlarged abdominal or pelvic lymph nodes. Reproductive: Status post hysterectomy. No adnexal masses. Other: No abdominal wall hernia or abnormality. No abdominopelvic ascites. Musculoskeletal: Multiple fractures are noted in the right inferior and superior pubic rami. There are edematous changes near the pubic symphysis on the right as well as the superior pubic ramus on the right likely related to focal hemorrhage. Correlate with any history of recent fall as none is given. Minimally displaced right sacral fracture is noted adjacent to the SI joint. IMPRESSION: Bladder wall thickening suspicious for underlying UTI consistent with the patient's given clinical history. A small focus of air is noted within anteriorly which may  be related to recent instrumentation. Correlate clinically. Right superior and inferior pubic rami fractures with associated localized edema suggesting an acute injury. Correlate with clinical history. Additionally a right sacral fracture is noted as well.  Diverticulosis without diverticulitis. Electronically Signed   By: Alcide Clever M.D.   On: 12/13/2022 19:34   DG Chest 2 View  Result Date: 12/13/2022 CLINICAL DATA:  Elevated white blood count. EXAM: CHEST - 2 VIEW COMPARISON:  July 01, 2022.  March 17, 2016. FINDINGS: The heart size and mediastinal contours are within normal limits. Both lungs are clear. Stable old midthoracic compression fracture is noted. IMPRESSION: No active cardiopulmonary disease. Electronically Signed   By: Lupita Raider M.D.   On: 12/13/2022 15:19    Microbiology: Results for orders placed or performed during the hospital encounter of 12/13/22  Urine Culture (for pregnant, neutropenic or urologic patients or patients with an indwelling urinary catheter)     Status: Abnormal   Collection Time: 12/13/22  5:01 PM   Specimen: Urine, Clean Catch  Result Value Ref Range Status   Specimen Description   Final    URINE, CLEAN CATCH Performed at [facility], [address], [place], [place] [address]    Special Requests   Final    NONE Performed at [facility], [address] [place], [place] [address]    Culture (A)  Final    <10,000 COLONIES/mL INSIGNIFICANT GROWTH Performed at [facility], [address] [place], [place] [address]    Report Status 12/14/2022 FINAL  Final  Culture, blood (routine x 2)     Status: None (Preliminary result)   Collection Time: 12/13/22  8:33 PM   Specimen: BLOOD  Result Value Ref Range Status   Specimen Description   Final    BLOOD LEFT ANTECUBITAL Performed at [facility], [address], [place], [place] [address]    Special Requests   Final    BOTTLES DRAWN AEROBIC AND ANAEROBIC Blood Culture results may not be optimal due to an excessive volume of blood received in culture bottles Performed at [facility], [address] [place], [place] [address]    Culture   Final    NO GROWTH 4  DAYS Performed at [facility], [address] [place], [place] [address]    Report Status PENDING  Incomplete  Culture, blood (routine x 2)     Status: None (Preliminary result)   Collection Time: 12/13/22  8:34 PM   Specimen: BLOOD RIGHT HAND  Result Value Ref Range Status   Specimen Description   Final    BLOOD RIGHT HAND Performed at [facility], [address], [place], [place] [address]    Special Requests   Final    BOTTLES DRAWN AEROBIC AND ANAEROBIC Blood Culture adequate volume Performed at [facility], [address], [place], [place] [address]    Culture   Final    NO GROWTH 4 DAYS Performed at [facility], [address] [place], [place] [address]    Report Status PENDING  Incomplete    Labs: CBC: Recent Labs  Lab 12/13/22 1440 12/14/22 0439 12/16/22 0447 12/17/22 0435  WBC 17.5* 12.6* 14.1* 13.2*  HGB 10.1* 8.9* 8.6* 9.1*  HCT 31.6* 28.8* 26.9* 29.0*  MCV 88.0 90.0 86.2 88.7  PLT 370 294 237 299   Basic Metabolic Panel: Recent Labs  Lab 12/13/22 1440 12/14/22 0439  NA 136 137  K 4.1 3.8  CL 100 104  CO2 24 26  GLUCOSE 117* 100*  BUN 20 18  CREATININE 0.82 0.71  CALCIUM 9.2 8.8*   Liver Function Tests: Recent Labs  Lab 12/13/22 1440  AST 33  ALT 29  ALKPHOS 83  BILITOT 0.7  PROT 7.0  ALBUMIN 3.3*    Discharge time spent: 35 minutes.  Signed: Jacquelin Hawking, MD [facility] 12/17/2022

## 2022-12-17 NOTE — TOC Transition Note (Signed)
Transition of Care [facility]) - CM/SW Discharge Note   Patient Details  Name: Sue Olsen MRN: [id number] Date of Birth: 1940-10-24  [facility] of [facility]) CM/SW Contact:  Amada Jupiter, LCSW Phone Number: 12/17/2022, 2:11 PM   Clinical Narrative:     Pt medically cleared for dc today. Returning to her [place] apt at [facility] with 24/7 caregivers in place per daughter.  Requiring [facility] transport - called at 1415.  [facility] to provide PT/OT follow up.  No further TOC needs.  Final next level of care: Home w Home Health Services Barriers to Discharge: Barriers Resolved   Patient Goals and CMS Choice      Discharge Placement                  Patient to be transferred to facility by: [facility] Name of family member notified: daughter Patient and family notified of of transfer: 12/17/22  Discharge Plan and Services Additional resources added to the After Visit Summary for   In-house Referral: Clinical Social Work              DME Arranged: N/A DME Agency: NA       HH Arranged: PT, OT HH Agency: Other - See comment Transport planner) Date HH Agency Contacted: 12/16/22   Representative spoke with at [facility] Agency: Rene Kocher  Social Determinants of Health (SDOH) Interventions SDOH Screenings   Food Insecurity: No Food Insecurity (12/17/2022)  Housing: Low Risk  (12/17/2022)  Transportation Needs: No Transportation Needs (12/17/2022)  Utilities: Not At Risk (12/17/2022)  Depression (PHQ2-9): Low Risk  (05/15/2019)  Tobacco Use: Low Risk  (12/13/2022)     Readmission Risk Interventions    12/14/2022    3:25 PM  Readmission Risk Prevention Plan  Transportation Screening Complete  PCP or Specialist Appt within 5-7 Days Complete  Home Care Screening Complete  Medication Review (RN CM) Complete

## 2022-12-17 NOTE — Discharge Instructions (Signed)
Sue Olsen,  You were in the hospital with a UTI and pelvic/sacral fractures. You were treated with antibiotics, which you have completed. For your fractures, the surgeon recommends touchdown (partial) weight bearing of your right foot with a walker and for you to follow-up with him in 3 weeks.

## 2022-12-17 NOTE — Progress Notes (Signed)
PT asked for me to premedicate the patient before her PT session

## 2022-12-18 LAB — CULTURE, BLOOD (ROUTINE X 2)
Culture: NO GROWTH
Culture: NO GROWTH
Special Requests: ADEQUATE

## 2023-01-17 ENCOUNTER — Encounter: Payer: Self-pay | Admitting: Orthopedic Surgery

## 2023-01-17 ENCOUNTER — Ambulatory Visit: Payer: Medicare Other | Admitting: Orthopedic Surgery

## 2023-01-17 ENCOUNTER — Other Ambulatory Visit: Payer: Self-pay

## 2023-01-17 DIAGNOSIS — S32599A Other specified fracture of unspecified pubis, initial encounter for closed fracture: Secondary | ICD-10-CM | POA: Diagnosis not present

## 2023-01-17 NOTE — Progress Notes (Unsigned)
Office Visit Note   Patient: Sue Olsen           Date of Birth: [birth year]           MRN: [id number] Visit Date: 01/17/2023 Requested by: Cleatis Polka., MD [address] [place],  [place] [address] PCP: Cleatis Polka., MD  Subjective: Chief Complaint  Patient presents with   Other    FOLLOW UP PUBIC RAMI FX AND NONDISPLACED SACRAL FX    HPI: Sue Olsen is a 82 y.o. female who presents to the office reporting improving hip pain.  She is about a month out from right sided rami and sacral fractures.  She has 24/7 care.  She lives in independent living.  She is able to stand and transfer on that right leg..                ROS: All systems reviewed are negative as they relate to the chief complaint within the history of present illness.  Patient denies fevers or chills.  Assessment & Plan: Visit Diagnoses:  1. Closed fracture of pubic ramus, unspecified laterality, initial encounter (HCC)     Plan: Impression is improvement in pain from pelvic rami fractures.  Radiographs today show minimal change in fracture alignment.  Clinically move hip around without any pain.  4-week return for final clinical recheck and AP pelvis radiograph only at that time.  Continue weightbearing as tolerated with walker.  Continue with physical therapy 2-3 times a week for 4 weeks.  Follow-Up Instructions: No follow-ups on file.   Orders:  Orders Placed This Encounter  Procedures   XR Pelvis 1-2 Views   No orders of the defined types were placed in this encounter.     Procedures: No procedures performed   Clinical Data: No additional findings.  Objective: Vital Signs: There were no vitals taken for this visit.  Physical Exam:  Constitutional: Patient appears well-developed HEENT:  Head: Normocephalic Eyes:EOM are normal Neck: Normal range of motion Cardiovascular: Normal rate Pulmonary/chest: Effort normal Neurologic: Patient is alert Skin: Skin is  warm Psychiatric: Patient has normal mood and affect  Ortho Exam: Ortho exam demonstrates palpable pedal pulses.  She has no pain with circumduction of the right hip or left hip.  Hip flexion strength is fairly symmetric on both sides.  Specialty Comments:  No specialty comments available.  Imaging: No results found.   PMFS History: Patient Active Problem List   Diagnosis Date Noted   Vitamin B12 deficiency 12/17/2022   Closed fracture of pubic ramus (HCC) 12/15/2022   Malnutrition of moderate degree 12/14/2022   UTI (urinary tract infection) 12/13/2022   Generalized weakness 07/01/2022   COVID-19 virus infection 07/01/2022   DNR (do not resuscitate)/DNI(Do Not Intubate) 07/01/2022   Dehydration 07/01/2022   Dementia, vascular, mixed, with behavioral disturbance (HCC) 03/01/2022   Hyperlipidemia 12/22/2012   Vitamin D deficiency 12/22/2012   Hematuria 12/21/2012   White coat syndrome without diagnosis of hypertension 12/19/2012   Allergic rhinitis 08/04/2011   Seborrheic dermatitis 07/14/2010   Past Medical History:  Diagnosis Date   Dementia (HCC)    Hyperlipidemia     No family history on file.  No past surgical history on file. Social History   Occupational History   Not on file  Tobacco Use   Smoking status: Never   Smokeless tobacco: Never  Vaping Use   Vaping status: Never Used  Substance and Sexual Activity   Alcohol use: No  Drug use: No   Sexual activity: Never

## 2023-02-14 ENCOUNTER — Encounter: Payer: Self-pay | Admitting: Orthopedic Surgery

## 2023-02-14 ENCOUNTER — Other Ambulatory Visit: Payer: Self-pay

## 2023-02-14 ENCOUNTER — Ambulatory Visit: Payer: Medicare Other | Admitting: Orthopedic Surgery

## 2023-02-14 DIAGNOSIS — S32599A Other specified fracture of unspecified pubis, initial encounter for closed fracture: Secondary | ICD-10-CM

## 2023-02-14 NOTE — Progress Notes (Signed)
   Office Visit Note   Patient: Sue Olsen           Date of Birth: 09/10/1940           MRN: [id number] Visit Date: 02/14/2023 Requested by: Cleatis Polka., MD [address] [place],  [place] [address] PCP: Cleatis Polka., MD  Subjective: Chief Complaint  Patient presents with    FOLLOW UP PUBIC RAMI FX AND NONDISPLACED SACRAL FX    HPI: Sue Olsen is a 82 y.o. female who presents to the office reporting pelvic pain.  1 month follow-up from pubic rami fracture.  Since she was last seen she had C. difficile x 2.  Date of injury was about 8 weeks ago.  No pain medication.  Patient does have dementia..                ROS: All systems reviewed are negative as they relate to the chief complaint within the history of present illness.  Patient denies fevers or chills.  Assessment & Plan: Visit Diagnoses:  1. Closed fracture of pubic ramus, unspecified laterality, initial encounter (HCC)     Plan: Impression is pubic rami fractures which do have some callus formation on plain radiographs today.  Plan is activity and weightbearing as tolerated with assistance.  She will follow-up with [place] as needed.  Follow-Up Instructions: No follow-ups on file.   Orders:  Orders Placed This Encounter  Procedures   XR Pelvis 1-2 Views   No orders of the defined types were placed in this encounter.     Procedures: No procedures performed   Clinical Data: No additional findings.  Objective: Vital Signs: There were no vitals taken for this visit.  Physical Exam:  Constitutional: Patient appears well-developed HEENT:  Head: Normocephalic Eyes:EOM are normal Neck: Normal range of motion Cardiovascular: Normal rate Pulmonary/chest: Effort normal Neurologic: Patient is alert Skin: Skin is warm Psychiatric: Patient has normal mood and affect  Ortho Exam: Ortho exam demonstrates no real pain with internal/external rotation of either leg.  No pain with axial loading  of either lower extremity.  Does have decreased muscle mass in both legs consistent with diminished activity level.  Specialty Comments:  No specialty comments available.  Imaging: No results found.   PMFS History: Patient Active Problem List   Diagnosis Date Noted   Vitamin B12 deficiency 12/17/2022   Closed fracture of pubic ramus (HCC) 12/15/2022   Malnutrition of moderate degree 12/14/2022   UTI (urinary tract infection) 12/13/2022   Generalized weakness 07/01/2022   COVID-19 virus infection 07/01/2022   DNR (do not resuscitate)/DNI(Do Not Intubate) 07/01/2022   Dehydration 07/01/2022   Dementia, vascular, mixed, with behavioral disturbance (HCC) 03/01/2022   Hyperlipidemia 12/22/2012   Vitamin D deficiency 12/22/2012   Hematuria 12/21/2012   White coat syndrome without diagnosis of hypertension 12/19/2012   Allergic rhinitis 08/04/2011   Seborrheic dermatitis 07/14/2010   Past Medical History:  Diagnosis Date   Dementia (HCC)    Hyperlipidemia     No family history on file.  History reviewed. No pertinent surgical history. Social History   Occupational History   Not on file  Tobacco Use   Smoking status: Never   Smokeless tobacco: Never  Vaping Use   Vaping status: Never Used  Substance and Sexual Activity   Alcohol use: No   Drug use: No   Sexual activity: Never

## 2023-02-20 ENCOUNTER — Encounter: Payer: Self-pay | Admitting: Orthopedic Surgery
